# Patient Record
Sex: Female | Born: 1995 | Race: White | Hispanic: No | Marital: Single | Smoking: Never smoker
Health system: Southern US, Community
[De-identification: ages and names within clinical notes are randomized; demographics above are authoritative.]

## PROBLEM LIST (undated history)

## (undated) DIAGNOSIS — N200 Calculus of kidney: Secondary | ICD-10-CM

## (undated) DIAGNOSIS — O99013 Anemia complicating pregnancy, third trimester: Secondary | ICD-10-CM

## (undated) HISTORY — PX: OTHER SURGICAL HISTORY: SHX169

## (undated) HISTORY — PX: TYMPANOSTOMY TUBE PLACEMENT: SHX32

## (undated) HISTORY — PX: TONSILLECTOMY: SUR1361

## (undated) HISTORY — PX: KIDNEY STONE SURGERY: SHX686

---
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
## 2014-05-05 ENCOUNTER — Ambulatory Visit: Payer: Self-pay

## 2014-10-08 LAB — OB RESULTS CONSOLE GC/CHLAMYDIA
CHLAMYDIA, DNA PROBE: NEGATIVE
GC PROBE AMP, GENITAL: NEGATIVE

## 2014-10-08 LAB — OB RESULTS CONSOLE RUBELLA ANTIBODY, IGM: Rubella: NON-IMMUNE/NOT IMMUNE

## 2014-10-08 LAB — OB RESULTS CONSOLE HIV ANTIBODY (ROUTINE TESTING): HIV: NONREACTIVE

## 2014-10-08 LAB — OB RESULTS CONSOLE VARICELLA ZOSTER ANTIBODY, IGG: VARICELLA IGG: IMMUNE

## 2014-10-08 LAB — OB RESULTS CONSOLE HEPATITIS B SURFACE ANTIGEN: Hepatitis B Surface Ag: NEGATIVE

## 2014-10-08 LAB — OB RESULTS CONSOLE RPR: RPR: NONREACTIVE

## 2015-03-18 NOTE — L&D Delivery Note (Signed)
Obstetrical Delivery Note   Date of Delivery:   05/07/2015 Primary OB:   [facility] Gestational Age/EDD: [redacted]w[redacted]d (Dated by LMP) Antepartum complications: anemia  Delivered By:   Farrel Conners, CNM  Delivery Type:   spontaneous vaginal delivery  Procedure Details:   Patient with overwhelming urge to push. Anterior lip easily reduced. With good maternal effort, there was a spontaneous vaginal delivery of a viable female infant with CAN x1, reduced on perineum. Presentation was ROA with a right nuchal hand. Baby placed on mother's abdomen and after cord stopped pulsating, the cord was clamped and the FOB cut the cord. Baby was then placed skin to skin with mother. Spontaneous delivery of intact placenta and 3 vessel cord. Anesthesia:    epidural and local for repair Intrapartum complications: nuchal cord x1 GBS:    Positive-was adequately treated Laceration:    2nd degree and perineal repaired with 2-0 Vicryl and 3-0 Chromic; rectal capsule was also reinforced Episiotomy:    none Placenta:    Via active 3rd stage. To pathology: no Estimated Blood Loss:  400  Baby:    Liveborn female, Apgars 8/9, weight 7#6.9oz/ Mia    Farrel Conners, CNM

## 2015-04-17 LAB — OB RESULTS CONSOLE GC/CHLAMYDIA
CHLAMYDIA, DNA PROBE: NEGATIVE
GC PROBE AMP, GENITAL: NEGATIVE

## 2015-04-17 LAB — OB RESULTS CONSOLE GBS: STREP GROUP B AG: POSITIVE

## 2015-04-29 ENCOUNTER — Encounter: Payer: Self-pay | Admitting: *Deleted

## 2015-04-29 ENCOUNTER — Inpatient Hospital Stay
Admission: EM | Admit: 2015-04-29 | Discharge: 2015-04-29 | Disposition: A | Discharge status: Home / Self Care | Payer: Medicaid Other | Attending: Obstetrics & Gynecology | Admitting: Obstetrics & Gynecology

## 2015-04-29 DIAGNOSIS — O26893 Other specified pregnancy related conditions, third trimester: Secondary | ICD-10-CM | POA: Insufficient documentation

## 2015-04-29 DIAGNOSIS — R0981 Nasal congestion: Secondary | ICD-10-CM | POA: Diagnosis present

## 2015-04-29 DIAGNOSIS — Z3A38 38 weeks gestation of pregnancy: Secondary | ICD-10-CM | POA: Insufficient documentation

## 2015-04-29 DIAGNOSIS — J029 Acute pharyngitis, unspecified: Secondary | ICD-10-CM | POA: Diagnosis present

## 2015-04-29 DIAGNOSIS — R509 Fever, unspecified: Secondary | ICD-10-CM | POA: Insufficient documentation

## 2015-04-29 LAB — URINALYSIS COMPLETE WITH MICROSCOPIC (ARMC ONLY)
BILIRUBIN URINE: NEGATIVE
Bacteria, UA: NONE SEEN
GLUCOSE, UA: NEGATIVE mg/dL
Hgb urine dipstick: NEGATIVE
Ketones, ur: NEGATIVE mg/dL
LEUKOCYTES UA: NEGATIVE
Nitrite: NEGATIVE
Protein, ur: NEGATIVE mg/dL
SPECIFIC GRAVITY, URINE: 1.017 (ref 1.005–1.030)
pH: 7 (ref 5.0–8.0)

## 2015-04-29 LAB — INFLUENZA PANEL BY PCR (TYPE A & B)
H1N1FLUPCR: NOT DETECTED
INFLBPCR: NEGATIVE
Influenza A By PCR: NEGATIVE

## 2015-04-29 MED ORDER — ACETAMINOPHEN 325 MG PO TABS
ORAL_TABLET | ORAL | Status: AC
  Filled 2015-04-29: qty 2

## 2015-04-29 MED ORDER — ACETAMINOPHEN 500 MG PO TABS
1000.0000 mg | ORAL_TABLET | Freq: Four times a day (QID) | ORAL | Status: DC | PRN
  Administered 2015-04-29: 1000 mg via ORAL

## 2015-04-29 MED ORDER — ACETAMINOPHEN 500 MG PO TABS
ORAL_TABLET | ORAL | Status: AC
  Filled 2015-04-29: qty 2

## 2015-04-29 MED ORDER — ONDANSETRON 4 MG PO TBDP: 4.0000 mg | ORAL_TABLET | Freq: Four times a day (QID) | ORAL | Status: DC | PRN

## 2015-04-29 NOTE — Final Progress Note (Signed)
Physician Final Progress Note  Patient ID: Kristen Stevens MRN: [id number] DOB/AGE: 1995-10-01 19 y.o.  Admit date: 04/29/2015 Admitting provider: Leola Brazil, MD Discharge date: 04/29/2015   Admission Diagnoses: Flu like symptoms Pregnancy at 38.3 weeks  Discharge Diagnoses:  same  Consults: None  Significant Findings/ Diagnostic Studies: 20 year old G1 p0 with EDC=05/10/2015 by LMP who presented at 38.3 weeks with complaints of nasal congestion and sore throat since last night, followed by low grade fever to 100 today, rhinorrhea, mild sore throat, occasional cough,  and right temporal headache. Had loose stools 2-3 times/day for past few days and an episode of vomiting this Am after breakfast. She has kept down lots of water today and a little gatorade. Has not taken any medications for her symptoms. Only medication is PNV.  A friend of hers has been diagnosed with the flu. She has not had a influenza vaccine this year. She denies CP, SOB, ear ache, vaginal bleeding, leakage of water,dysuria, neck pain or rash.  Prenatal care at [facility] has been remarkable for concerns for low lying placenta (now resolved), mild anemia and GBS positive.  Medical Hx remarkable for kidney stone Exam:  Patient Vitals for the past 24 hrs:  BP Temp Temp src Pulse Resp Height Weight  04/29/15 2201 (!) 102/51 mmHg 97.9 F (36.6 C) Oral 78 - - -  04/29/15 1850 102/66 mmHg - - 91 14 - -  04/29/15 1826 107/74 mmHg - - (!) 102 - - -  04/29/15 1825 - 100.2 F (37.9 C) Oral - 16  (1.575 m) 68.04 kg (150 lb)  Head: no frontal or maxillary sinus tenderness Nose: boggy, nasla mucosa Throat: no exudates, mild inflammation Neck: no cervical lymphadenitis Lungs: CTA Heart: RRR without murmur Abdomen: soft, NT, BS active, cephalic presentation FHR: reactive with baseline 140 and accelerations to 160s, moderate variability irregular contractions, mostly irritability Results for orders placed or performed  during the hospital encounter of 04/29/15 (from the past 24 hour(s))  Influenza panel by PCR (type A & B, H1N1)     Status: None   Collection Time: 04/29/15  7:49 PM  Result Value Ref Range   Influenza A By PCR NEGATIVE NEGATIVE   Influenza B By PCR NEGATIVE NEGATIVE   H1N1 flu by pcr NOT DETECTED NOT DETECTED  Urinalysis complete, with microscopic ([facility] only)     Status: Abnormal   Collection Time: 04/29/15  7:55 PM  Result Value Ref Range   Color, Urine YELLOW (A) YELLOW   APPearance CLOUDY (A) CLEAR   Glucose, UA NEGATIVE NEGATIVE mg/dL   Bilirubin Urine NEGATIVE NEGATIVE   Ketones, ur NEGATIVE NEGATIVE mg/dL   Specific Gravity, Urine 1.017 1.005 - 1.030   Hgb urine dipstick NEGATIVE NEGATIVE   pH 7.0 5.0 - 8.0   Protein, ur NEGATIVE NEGATIVE mg/dL   Nitrite NEGATIVE NEGATIVE   Leukocytes, UA NEGATIVE NEGATIVE   RBC / HPF 0-5 0 - 5 RBC/hpf   WBC, UA 0-5 0 - 5 WBC/hpf   Bacteria, UA NONE SEEN NONE SEEN   Squamous Epithelial / LPF 0-5 (A) NONE SEEN   Mucous PRESENT    Amorphous Crystal PRESENT     While awaiting lab results, patient was given tylenol m which resolved her headache and fever. She ate  And drank without nausea and vomiting. No evidence of UTI and influenza test was negative A: Flu like symptoms/ viral syndrome IUP at 38.3 weeks Reactive non stress test P: Dc home  with instructions to take Tylenol for fever and body aches, antihistimines to decrease nasal secretions, saline nasal spray for nasal congestion, Mucinex if needed for cough.   Discharge Condition: stable  Disposition: 01-Home or Self Care  Diet: as tolerated  Discharge Activity: as tolerated     Medication List    ASK your doctor about these medications        multivitamin-prenatal 27-0.8 MG Tabs tablet  Take 1 tablet by mouth daily at 12 noon.       Follow up at [facility] scheduled for 02/27/2016.  Total time spent taking care of this patient: 20 minutes  Signed: Farrel Conners 04/29/2015, 10:33 PM

## 2015-05-01 LAB — CULTURE, GROUP A STREP (THRC)

## 2015-05-01 LAB — URINE CULTURE

## 2015-05-05 ENCOUNTER — Observation Stay
Admission: AD | Admit: 2015-05-05 | Discharge: 2015-05-05 | Disposition: A | Discharge status: Home / Self Care | Payer: Medicaid Other | Source: Home / Self Care | Attending: Obstetrics & Gynecology | Admitting: Obstetrics & Gynecology

## 2015-05-05 DIAGNOSIS — R109 Unspecified abdominal pain: Secondary | ICD-10-CM

## 2015-05-05 DIAGNOSIS — Z3A39 39 weeks gestation of pregnancy: Secondary | ICD-10-CM

## 2015-05-05 DIAGNOSIS — O26899 Other specified pregnancy related conditions, unspecified trimester: Secondary | ICD-10-CM

## 2015-05-05 MED ORDER — ONDANSETRON HCL 4 MG/2ML IJ SOLN: 4.0000 mg | Freq: Four times a day (QID) | INTRAMUSCULAR | Status: DC | PRN

## 2015-05-05 MED ORDER — ACETAMINOPHEN 325 MG PO TABS: 650.0000 mg | ORAL_TABLET | ORAL | Status: DC | PRN

## 2015-05-05 NOTE — Progress Notes (Signed)
Dr Tiburcio Pea called, informed of current events. Informed of UC pattern, FHR tracing, VS, SVE, pt was scheduled for induction tonight and was moved to tomorrow. Order received to continue to monitor until 9pm, if FHR remains reassuring and no cervical change may d/c home to return tomorrow for scheduled induction.

## 2015-05-05 NOTE — OB Triage Note (Addendum)
Pt presents to L&D very teary with her mother, who was very concerned of recent events. Pt and the FOB got into a heated argument. Pt's mother, was there and states the patient got so upset over the argument and tripped over her feet and fell. Pt states she has been having contractions all day. Pt was also posted for elective induction tonight but was bumped. I asked if that precipitated the fight and she denied it. Pts mother pulled me aside and said she had never seen her daughter that upset and her daughter was saying things like she wanted to give the baby up for adoption and the patient's mother said she was honestly afraid she would hurt herself or the baby. I went in to assess the pt alone, and talked with her about the situation. I asked her if she wanted to hurt herself or the baby or had any questions about adoptions. That we were here to help her if she needed help and could get her in touch with resources. Pt denied all, and said we just had a really bad fight and I was saying things I didn't mean. Pt denies LOF and says she feels contractions but they aren't that painful. EFM applied and explained, plan to monitor fetal and maternal well being and assess for labor and s/p fall.

## 2015-05-05 NOTE — Progress Notes (Signed)
Pt in much better emotional state as well as pt's mother. Reports feeling contractions but reports they aren't very painful. Cervix rechecked because of contraction pattern, no cervical change, reviewed term d/c instructions and reviewed induction instructions for tomorrow night. Pt verbalizes undsertanding.

## 2015-05-05 NOTE — Discharge Instructions (Signed)
Call provider or return to birthplace with:  1. Strong regular contractions every 5 minutes. 2. Leaking of fluid from your vagina 3. Vaginal bleeding: Bright red or heavy like a period 4. Decreased Fetal movement  Return to [facility] 05/06/15 at 7:30 pm for induction

## 2015-05-06 ENCOUNTER — Inpatient Hospital Stay
Admission: RE | Admit: 2015-05-06 | Discharge: 2015-05-09 | DRG: 775 | Disposition: A | Discharge status: Home / Self Care | Payer: Medicaid Other | Attending: Obstetrics and Gynecology | Admitting: Obstetrics and Gynecology

## 2015-05-06 DIAGNOSIS — O480 Post-term pregnancy: Secondary | ICD-10-CM | POA: Diagnosis present

## 2015-05-06 DIAGNOSIS — Z3A39 39 weeks gestation of pregnancy: Secondary | ICD-10-CM

## 2015-05-06 DIAGNOSIS — O26899 Other specified pregnancy related conditions, unspecified trimester: Secondary | ICD-10-CM

## 2015-05-06 DIAGNOSIS — R109 Unspecified abdominal pain: Secondary | ICD-10-CM

## 2015-05-06 LAB — CBC
HEMATOCRIT: 30.6 % — AB (ref 35.0–47.0)
Hemoglobin: 10 g/dL — ABNORMAL LOW (ref 12.0–16.0)
MCH: 24.8 pg — AB (ref 26.0–34.0)
MCHC: 32.6 g/dL (ref 32.0–36.0)
MCV: 76.1 fL — AB (ref 80.0–100.0)
Platelets: 252 10*3/uL (ref 150–440)
RBC: 4.02 MIL/uL (ref 3.80–5.20)
RDW: 14.8 % — AB (ref 11.5–14.5)
WBC: 15.7 10*3/uL — ABNORMAL HIGH (ref 3.6–11.0)

## 2015-05-06 LAB — TYPE AND SCREEN
ABO/RH(D): O POS
Antibody Screen: NEGATIVE

## 2015-05-06 LAB — ABO/RH: ABO/RH(D): O POS

## 2015-05-06 MED ORDER — SODIUM CHLORIDE 0.9 % IV SOLN
1.0000 g | INTRAVENOUS | Status: DC
  Administered 2015-05-07 (×4): 1 g via INTRAVENOUS
  Filled 2015-05-06 (×9): qty 1000

## 2015-05-06 MED ORDER — LACTATED RINGERS IV SOLN
INTRAVENOUS | Status: DC
  Administered 2015-05-06 – 2015-05-07 (×3): via INTRAVENOUS
  Administered 2015-05-07: 125 mL/h via INTRAVENOUS

## 2015-05-06 MED ORDER — OXYTOCIN 10 UNIT/ML IJ SOLN
INTRAMUSCULAR | Status: AC
  Filled 2015-05-06: qty 2

## 2015-05-06 MED ORDER — LIDOCAINE HCL (PF) 1 % IJ SOLN
INTRAMUSCULAR | Status: AC
  Filled 2015-05-06: qty 30

## 2015-05-06 MED ORDER — OXYTOCIN BOLUS FROM INFUSION: 500.0000 mL | INTRAVENOUS | Status: DC

## 2015-05-06 MED ORDER — TERBUTALINE SULFATE 1 MG/ML IJ SOLN: 0.2500 mg | Freq: Once | INTRAMUSCULAR | Status: DC | PRN

## 2015-05-06 MED ORDER — DINOPROSTONE 10 MG VA INST
10.0000 mg | VAGINAL_INSERT | Freq: Once | VAGINAL | Status: DC
  Filled 2015-05-06 (×2): qty 1

## 2015-05-06 MED ORDER — LACTATED RINGERS IV SOLN: 500.0000 mL | INTRAVENOUS | Status: DC | PRN

## 2015-05-06 MED ORDER — OXYTOCIN 40 UNITS IN LACTATED RINGERS INFUSION - SIMPLE MED
2.5000 [IU]/h | INTRAVENOUS | Status: DC
  Administered 2015-05-07: 39.96 [IU]/h via INTRAVENOUS
  Filled 2015-05-06: qty 1000

## 2015-05-06 MED ORDER — MISOPROSTOL 25 MCG QUARTER TABLET
25.0000 ug | ORAL_TABLET | ORAL | Status: DC
  Administered 2015-05-06: 25 ug via VAGINAL
  Filled 2015-05-06 (×2): qty 1

## 2015-05-06 MED ORDER — ONDANSETRON HCL 4 MG/2ML IJ SOLN: 4.0000 mg | Freq: Four times a day (QID) | INTRAMUSCULAR | Status: DC | PRN

## 2015-05-06 MED ORDER — ACETAMINOPHEN 325 MG PO TABS
650.0000 mg | ORAL_TABLET | ORAL | Status: DC | PRN
  Administered 2015-05-06: 650 mg via ORAL
  Filled 2015-05-06: qty 2

## 2015-05-06 MED ORDER — ZOLPIDEM TARTRATE 5 MG PO TABS
5.0000 mg | ORAL_TABLET | Freq: Every evening | ORAL | Status: DC | PRN
  Administered 2015-05-06: 5 mg via ORAL
  Filled 2015-05-06: qty 1

## 2015-05-06 MED ORDER — SODIUM CHLORIDE 0.9 % IV SOLN
2.0000 g | Freq: Once | INTRAVENOUS | Status: AC
  Administered 2015-05-06: 2 g via INTRAVENOUS
  Filled 2015-05-06: qty 2000

## 2015-05-06 MED ORDER — BUTORPHANOL TARTRATE 1 MG/ML IJ SOLN
1.0000 mg | INTRAMUSCULAR | Status: DC | PRN
  Administered 2015-05-07 (×4): 1 mg via INTRAVENOUS
  Filled 2015-05-06 (×5): qty 1

## 2015-05-06 MED ORDER — AMMONIA AROMATIC IN INHA
RESPIRATORY_TRACT | Status: AC
  Filled 2015-05-06: qty 10

## 2015-05-06 MED ORDER — MISOPROSTOL 200 MCG PO TABS
ORAL_TABLET | ORAL | Status: AC
  Filled 2015-05-06: qty 4

## 2015-05-06 NOTE — Anesthesia Preprocedure Evaluation (Addendum)
Anesthesia Evaluation  Patient identified by MRN, date of birth, ID band Patient awake    Reviewed: Allergy & Precautions, H&P , NPO status , Patient's Chart, lab work & pertinent test results, reviewed documented beta blocker date and time   Airway Mallampati: III  TM Distance: >3 FB Neck ROM: full    Dental no notable dental hx. (+) Teeth Intact   Pulmonary neg pulmonary ROS,    Pulmonary exam normal breath sounds clear to auscultation       Cardiovascular Exercise Tolerance: Good negative cardio ROS Normal cardiovascular exam Rhythm:regular Rate:Normal     Neuro/Psych negative neurological ROS  negative psych ROS   GI/Hepatic negative GI ROS, Neg liver ROS,   Endo/Other  negative endocrine ROS  Renal/GU negative Renal ROS  negative genitourinary   Musculoskeletal   Abdominal   Peds  Hematology negative hematology ROS (+)   Anesthesia Other Findings   Reproductive/Obstetrics (+) Pregnancy                             Anesthesia Physical Anesthesia Plan  ASA: II  Anesthesia Plan: Regional and Epidural   Post-op Pain Management:    Induction:   Airway Management Planned:   Additional Equipment:   Intra-op Plan:   Post-operative Plan:   Informed Consent: I have reviewed the patients History and Physical, chart, labs and discussed the procedure including the risks, benefits and alternatives for the proposed anesthesia with the patient or authorized representative who has indicated his/her understanding and acceptance.     Plan Discussed with: CRNA  Anesthesia Plan Comments:         Anesthesia Quick Evaluation  

## 2015-05-06 NOTE — H&P (Signed)
History and Physical Interval Note:  05/06/2015 9:04 PM  Kristen Stevens  has presented today for induction of labor with the diagnosis of post term pregnancy.   The various methods of treatment have been discussed with the patient and family. After consideration of risks, benefits and other options for treatment, the patient agrees to cervadil inductions .  The patient's history has been reviewed, patient examined, no change in status, stable.   I have reviewed the patient's chart and labs.  Questions were answered to the patient's satisfaction.    [name]

## 2015-05-07 ENCOUNTER — Encounter: Payer: Self-pay | Admitting: *Deleted

## 2015-05-07 ENCOUNTER — Encounter: Payer: Self-pay | Admitting: Anesthesiology

## 2015-05-07 ENCOUNTER — Inpatient Hospital Stay: Payer: Medicaid Other | Admitting: Anesthesiology

## 2015-05-07 MED ORDER — PHENYLEPHRINE 40 MCG/ML (10ML) SYRINGE FOR IV PUSH (FOR BLOOD PRESSURE SUPPORT)
80.0000 ug | PREFILLED_SYRINGE | INTRAVENOUS | Status: DC | PRN
  Filled 2015-05-07: qty 2

## 2015-05-07 MED ORDER — ONDANSETRON HCL 4 MG PO TABS: 4.0000 mg | ORAL_TABLET | ORAL | Status: DC | PRN

## 2015-05-07 MED ORDER — EPHEDRINE 5 MG/ML INJ
10.0000 mg | INTRAVENOUS | Status: DC | PRN
  Filled 2015-05-07: qty 2

## 2015-05-07 MED ORDER — HYDROCODONE-ACETAMINOPHEN 5-325 MG PO TABS: 1.0000 | ORAL_TABLET | ORAL | Status: DC | PRN

## 2015-05-07 MED ORDER — LANOLIN HYDROUS EX OINT: TOPICAL_OINTMENT | CUTANEOUS | Status: DC | PRN

## 2015-05-07 MED ORDER — FENTANYL 2.5 MCG/ML W/ROPIVACAINE 0.2% IN NS 100 ML EPIDURAL INFUSION (ARMC-ANES)
9.0000 mL/h | EPIDURAL | Status: DC
  Filled 2015-05-07: qty 100

## 2015-05-07 MED ORDER — FERROUS SULFATE 325 (65 FE) MG PO TABS
325.0000 mg | ORAL_TABLET | Freq: Every day | ORAL | Status: DC
  Administered 2015-05-08: 325 mg via ORAL
  Filled 2015-05-07: qty 1

## 2015-05-07 MED ORDER — BUPIVACAINE HCL (PF) 0.25 % IJ SOLN
INTRAMUSCULAR | Status: DC | PRN
  Administered 2015-05-07 (×2): 4 mL via EPIDURAL

## 2015-05-07 MED ORDER — IBUPROFEN 600 MG PO TABS
ORAL_TABLET | ORAL | Status: AC
  Administered 2015-05-07: 600 mg via ORAL
  Filled 2015-05-07: qty 1

## 2015-05-07 MED ORDER — TERBUTALINE SULFATE 1 MG/ML IJ SOLN: 0.2500 mg | Freq: Once | INTRAMUSCULAR | Status: DC | PRN

## 2015-05-07 MED ORDER — DIPHENHYDRAMINE HCL 50 MG/ML IJ SOLN: 12.5000 mg | INTRAMUSCULAR | Status: DC | PRN

## 2015-05-07 MED ORDER — FENTANYL 2.5 MCG/ML W/ROPIVACAINE 0.2% IN NS 100 ML EPIDURAL INFUSION (ARMC-ANES): 10.0000 mL/h | EPIDURAL | Status: DC

## 2015-05-07 MED ORDER — ONDANSETRON HCL 4 MG/2ML IJ SOLN: 4.0000 mg | INTRAMUSCULAR | Status: DC | PRN

## 2015-05-07 MED ORDER — DOCUSATE SODIUM 100 MG PO CAPS
100.0000 mg | ORAL_CAPSULE | Freq: Every day | ORAL | Status: DC
  Administered 2015-05-08 – 2015-05-09 (×2): 100 mg via ORAL
  Filled 2015-05-07 (×3): qty 1

## 2015-05-07 MED ORDER — IBUPROFEN 600 MG PO TABS
600.0000 mg | ORAL_TABLET | Freq: Four times a day (QID) | ORAL | Status: DC | PRN
  Administered 2015-05-07 – 2015-05-08 (×4): 600 mg via ORAL
  Filled 2015-05-07 (×4): qty 1

## 2015-05-07 MED ORDER — LIDOCAINE HCL (PF) 1 % IJ SOLN
INTRAMUSCULAR | Status: DC | PRN
  Administered 2015-05-07: 3 mL via SUBCUTANEOUS
  Administered 2015-05-07: 30 mL

## 2015-05-07 MED ORDER — LACTATED RINGERS IV SOLN
500.0000 mL | Freq: Once | INTRAVENOUS | Status: AC
  Administered 2015-05-07: 500 mL via INTRAVENOUS

## 2015-05-07 MED ORDER — OXYTOCIN 40 UNITS IN LACTATED RINGERS INFUSION - SIMPLE MED
1.0000 m[IU]/min | INTRAVENOUS | Status: DC
  Administered 2015-05-07: 1 m[IU]/min via INTRAVENOUS
  Administered 2015-05-07: 5 m[IU]/min via INTRAVENOUS
  Administered 2015-05-07: 6 m[IU]/min via INTRAVENOUS
  Administered 2015-05-07: 7 m[IU]/min via INTRAVENOUS
  Administered 2015-05-07: 3 m[IU]/min via INTRAVENOUS

## 2015-05-07 MED ORDER — LIDOCAINE-EPINEPHRINE (PF) 1.5 %-1:200000 IJ SOLN
INTRAMUSCULAR | Status: DC | PRN
  Administered 2015-05-07: 3 mL via PERINEURAL

## 2015-05-07 MED ORDER — BENZOCAINE-MENTHOL 20-0.5 % EX AERO
INHALATION_SPRAY | CUTANEOUS | Status: AC
  Filled 2015-05-07: qty 56

## 2015-05-07 MED ORDER — FENTANYL 2.5 MCG/ML W/ROPIVACAINE 0.2% IN NS 100 ML EPIDURAL INFUSION (ARMC-ANES)
EPIDURAL | Status: DC | PRN
  Administered 2015-05-07: 10 mL/h via EPIDURAL

## 2015-05-07 MED ORDER — BENZOCAINE-MENTHOL 20-0.5 % EX AERO
1.0000 | INHALATION_SPRAY | CUTANEOUS | Status: DC | PRN
Start: 2015-05-07 — End: 2015-05-09

## 2015-05-07 MED ORDER — SIMETHICONE 80 MG PO CHEW
80.0000 mg | CHEWABLE_TABLET | ORAL | Status: DC | PRN
Start: 2015-05-07 — End: 2015-05-09

## 2015-05-07 MED ORDER — LACTATED RINGERS IV SOLN: 500.0000 mL | Freq: Once | INTRAVENOUS | Status: DC

## 2015-05-07 MED ORDER — PRENATAL MULTIVITAMIN CH
1.0000 | ORAL_TABLET | Freq: Every day | ORAL | Status: DC
  Administered 2015-05-08 – 2015-05-09 (×2): 1 via ORAL
  Filled 2015-05-07 (×2): qty 1

## 2015-05-07 NOTE — Discharge Summary (Signed)
Physician Obstetric Discharge Summary  Patient ID: Kristen Stevens MRN: [id number] DOB/AGE: 1995/08/04 19 y.o.   Date of Admission: 05/06/2015  Date of Discharge:   Admitting Diagnosis: Induction of labor at [redacted]w[redacted]d  Secondary Diagnosis: Patient Active Problem List   Diagnosis Date Noted  . Postpartum care following vaginal delivery 05/09/2015  . Post-dates pregnancy 05/06/2015  . Labor and delivery, indication for care 05/05/2015  . Abdominal pain affecting pregnancy 05/05/2015     Mode of Delivery: normal spontaneous vaginal delivery on 05/07/2015       Discharge Diagnosis: Term intrauterine pregnancy delivered,    Intrapartum Procedures: epidural, GBS prophylaxis, pitocin augmentation, placement of intrauterine catheter and cytotec induction, repair of second degree perineal laceration   Post partum procedures: none  Complications:none apparent  Brief Hospital Course  Kristen Stevens is a G1P1001 who had a SVD on 05/07/2015 after an elective induction;  for further details of this delivery, please refer to the delivery note.  Patient had an uncomplicated postpartum course.  By time of discharge on PPD#2, her pain was controlled on oral pain medications; she had appropriate lochia and was ambulating, voiding without difficulty and tolerating regular diet.  She was deemed stable for discharge to home.   Labs: CBC Latest Ref Rng 05/09/2015 05/08/2015 05/06/2015  WBC 3.6 - 11.0 K/uL - 17.7(H) 15.7(H)  Hemoglobin 12.0 - 16.0 g/dL - 7.4(L) 10.0(L)  Hematocrit 35.0 - 47.0 % 22.0(L) 22.7(L) 30.6(L)  Platelets 150 - 440 K/uL - 182 252   O POS/ RNI/ VI/ GBS positive  Physical exam:  Blood pressure 102/69, pulse 69, temperature 98.5 F (36.9 C), temperature source Oral, resp. rate 20, SpO2 100 %,  General: alert and no distress Lochia: appropriate Abdomen: soft, NT Uterine Fundus: firm Extremities: No evidence of DVT seen on physical exam. No lower extremity edema.  Discharge  Instructions: Per After Visit Summary. Activity: Advance as tolerated. Pelvic rest for 6 weeks.  Also refer to Discharge Instructions Diet: Regular Medications:   Medication List    TAKE these medications        benzocaine-Menthol 20-0.5 % Aero  Commonly known as:  DERMOPLAST  Apply 1 application topically as needed for irritation (perineal discomfort).     docusate sodium 100 MG capsule  Commonly known as:  COLACE  Take 1 capsule (100 mg total) by mouth daily.     ferrous sulfate 325 (65 FE) MG tablet  Take 1 tablet (325 mg total) by mouth 3 (three) times daily with meals.     ibuprofen 600 MG tablet  Commonly known as:  ADVIL,MOTRIN  Take 1 tablet (600 mg total) by mouth every 6 (six) hours as needed for mild pain, moderate pain or cramping.     multivitamin-prenatal 27-0.8 MG Tabs tablet  Take 1 tablet by mouth daily at 12 noon.       Outpatient follow up:  Follow-up Information    Follow up with GUTIERREZ, COLLEEN, CNM In 6 weeks.   Specialty:  Certified Nurse Midwife   Why:  for routine post partum visit   Contact information:   [address] [place] [address] [phone]      Postpartum contraception: IUD  Discharged Condition: good  Discharged to: home   Newborn Data: Disposition:home with mother  Apgars: APGAR (1 MIN): 8   APGAR (5 MINS): 9   APGAR (10 MINS):    Baby Feeding: Breast (pumping) and formula  Ward, Elenora Fender, MD 05/09/2015 12:04 PM

## 2015-05-07 NOTE — Anesthesia Preprocedure Evaluation (Signed)
Anesthesia Evaluation    Airway        Dental   Pulmonary           Cardiovascular      Neuro/Psych    GI/Hepatic   Endo/Other    Renal/GU      Musculoskeletal   Abdominal   Peds  Hematology   Anesthesia Other Findings Mom says that pt not allergic to lidocaine. Dentist injected with novocaine  And that side of her face swell. No other problems, such as, hypotension fainting etc. OK to use lidocaine for numbing the skin.  Reproductive/Obstetrics                             Anesthesia Physical Anesthesia Plan Anesthesia Quick Evaluation

## 2015-05-07 NOTE — Anesthesia Procedure Notes (Signed)
Epidural Patient location during procedure: OB Start time: 05/07/2015 1:06 PM End time: 05/07/2015 1:16 PM  Staffing Anesthesiologist: Berdine Addison Resident/CRNA: Malva Cogan Performed by: resident/CRNA   Preanesthetic Checklist Completed: patient identified, site marked, surgical consent, pre-op evaluation, timeout performed, IV checked, risks and benefits discussed and monitors and equipment checked  Epidural Patient position: sitting Prep: Betadine Patient monitoring: heart rate, continuous pulse ox and blood pressure Approach: midline Location: L3-L4 Injection technique: LOR saline  Needle:  Needle type: Tuohy  Needle gauge: 18 G Needle length: 9 cm and 9 Needle insertion depth: 6 cm Catheter type: closed end flexible Catheter size: 20 Guage Catheter at skin depth: 11 cm Test dose: negative and 1.5% lidocaine with Epi 1:200 K  Assessment Sensory level: T10 Events: blood not aspirated, injection not painful, no injection resistance, negative IV test and no paresthesia  Additional Notes   Patient tolerated the insertion well without complications.Reason for block:procedure for pain

## 2015-05-07 NOTE — Progress Notes (Signed)
L&D Progress Notes  S: Much more comfortable after epidural, still feeling pressure in pelvis  O: vss General: trying to sleep, but complains of above FHR 120s with accels to 140s, moderate variability Toco: difficulty picking up contractions with external toco SROM for clear fluid at 1148 Cervix: 3-3.5/80%/-1 IUPC inserted and contractions are q2-3 min apart, but not adequate mvus Pitocin was stopped until had better pain control with epidural  A: Some progress, now with better pain control P: Restart Pitocin and titrate to 200-250 mvus Consult anesthesia to see if can provide denser block  Naysa Puskas, CNM

## 2015-05-07 NOTE — Progress Notes (Signed)
L&D Progress Note  20 year old G1 P0 with EDC=05/10/2015 presented last night for a elective induction with cervix 1/80%. She received one dose of Cytotec and has been contracting during the night. Received 3 doses of Stadol, last one about 30 min ago.  S: Sleepy from Stadol. Frustrated with lack of progress. C: BP 107/62 mmHg  Pulse 74  Temp(Src) 98.8 F (37.1 C) (Oral)  Resp 18  Ht  (1.575 m)  Wt 70.761 kg (156 lb)  BMI 28.53 kg/m2  General: sleepy in no acute distress FHR: 130 with accelerations and moderate variability Toco: q1-5 min apart with coupling, slept thru some of these contractions Cervix: 1+/80%/-1 to -2  A: IUP at 39.4 weeks admitted for elective IOL-little progress so far GBS positive-PPX already started  P: Plan to let patient eat a little something and shower then start Pitocin Reassured patient that it often takes time to get a primip in labor, and it is not uncommon for early labor to take time.  Kristen Stevens, CNM  Kristen Stevens, CNM

## 2015-05-08 LAB — CBC
HCT: 22.7 % — ABNORMAL LOW (ref 35.0–47.0)
Hemoglobin: 7.4 g/dL — ABNORMAL LOW (ref 12.0–16.0)
MCH: 24.4 pg — AB (ref 26.0–34.0)
MCHC: 32.6 g/dL (ref 32.0–36.0)
MCV: 74.9 fL — ABNORMAL LOW (ref 80.0–100.0)
PLATELETS: 182 10*3/uL (ref 150–440)
RBC: 3.03 MIL/uL — AB (ref 3.80–5.20)
RDW: 14.9 % — ABNORMAL HIGH (ref 11.5–14.5)
WBC: 17.7 10*3/uL — ABNORMAL HIGH (ref 3.6–11.0)

## 2015-05-08 LAB — RPR: RPR Ser Ql: NONREACTIVE

## 2015-05-08 MED ORDER — FERROUS SULFATE 325 (65 FE) MG PO TABS
325.0000 mg | ORAL_TABLET | Freq: Two times a day (BID) | ORAL | Status: DC
  Administered 2015-05-08: 325 mg via ORAL
  Filled 2015-05-08: qty 1

## 2015-05-08 MED ORDER — INFLUENZA VAC SPLIT QUAD 0.5 ML IM SUSY: 0.5000 mL | PREFILLED_SYRINGE | INTRAMUSCULAR | Status: DC

## 2015-05-08 NOTE — Progress Notes (Signed)
Post Partum Day 1 Subjective: Doing well, no complaints.  Tolerating regular diet, pain with PO meds, voiding and ambulating without difficulty.  No CP SOB F/C N/V or leg pain No HA change of vision, RUQ/epigastric pain  Objective: BP 102/55 mmHg  Pulse 61  Temp(Src) 97.9 F (36.6 C) (Oral)  Resp 18  Ht  (1.575 m)  Wt 70.761 kg (156 lb)  BMI 28.53 kg/m2  SpO2 99%  LMP 08/03/2014  Breastfeeding  Physical Exam:  General: NAD CV: RRR Pulm: nl effort, CTABL Lochia: moderate Uterine Fundus: fundus firm and below umbilicus DVT Evaluation: no cords, ttp LEs    Recent Labs  05/06/15 2104 05/08/15 0448  HGB 10.0* 7.4*  HCT 30.6* 22.7*  WBC 15.7* 17.7*  PLT 252 182    Assessment/Plan: 19 y.o. G1P1001 postpartum day # 1  1. Postpartum care vaginal delivery 2. Discharge to home with baby tomorrow 3. Follow up care with Consuella Lose    ----- Tresea Mall, CNM   This patient and plan were discussed with Dr Vergie Living 05/08/2015     Changed to bid iron and will get am Hct on 2/22 to ensure stability. No current s/s of anemia and hopefully no need for transfusion since young and healthy  Kristen Copa MD [facility]  Pager: [phone]

## 2015-05-08 NOTE — Anesthesia Postprocedure Evaluation (Signed)
Anesthesia Post Note  Patient: Kristen Stevens  Procedure(s) Performed: * No procedures listed *  Patient location during evaluation: Mother Baby Anesthesia Type: Epidural Level of consciousness: awake and alert Pain management: pain level controlled Vital Signs Assessment: post-procedure vital signs reviewed and stable Respiratory status: spontaneous breathing Cardiovascular status: blood pressure returned to baseline Postop Assessment: no headache and no backache Anesthetic complications: no    Last Vitals:  Filed Vitals:   05/07/15 2312 05/08/15 0418  BP: 116/69 104/58  Pulse: 98 66  Temp: 36.9 C 36.6 C  Resp: 18 18    Last Pain:  Filed Vitals:   05/08/15 0418  PainSc: 0-No pain                 Mathews Argyle P

## 2015-05-09 LAB — HEMATOCRIT: HCT: 22 % — ABNORMAL LOW (ref 35.0–47.0)

## 2015-05-09 MED ORDER — IBUPROFEN 600 MG PO TABS
600.0000 mg | ORAL_TABLET | Freq: Four times a day (QID) | ORAL | Status: DC | PRN
Start: 2015-05-09 — End: 2016-12-25

## 2015-05-09 MED ORDER — DOCUSATE SODIUM 100 MG PO CAPS: 100.0000 mg | ORAL_CAPSULE | Freq: Every day | ORAL | Status: DC

## 2015-05-09 MED ORDER — BENZOCAINE-MENTHOL 20-0.5 % EX AERO: 1.0000 "application " | INHALATION_SPRAY | CUTANEOUS | Status: DC | PRN

## 2015-05-09 MED ORDER — FERROUS SULFATE 325 (65 FE) MG PO TABS: 325.0000 mg | ORAL_TABLET | Freq: Three times a day (TID) | ORAL | Status: DC

## 2015-05-09 MED ORDER — FERROUS SULFATE 325 (65 FE) MG PO TABS
325.0000 mg | ORAL_TABLET | Freq: Three times a day (TID) | ORAL | Status: DC
  Administered 2015-05-09: 325 mg via ORAL
  Filled 2015-05-09: qty 1

## 2015-05-09 MED ORDER — MEASLES, MUMPS & RUBELLA VAC ~~LOC~~ INJ
0.5000 mL | INJECTION | Freq: Once | SUBCUTANEOUS | Status: AC
  Administered 2015-05-09: 0.5 mL via SUBCUTANEOUS
  Filled 2015-05-09: qty 0.5

## 2015-05-09 NOTE — Progress Notes (Signed)
Discharge instructions reviewed with patient for herself and newborn.  Pt verb u/o

## 2015-05-09 NOTE — Progress Notes (Signed)
Discharged to home to car with newborn and auxillary

## 2015-05-09 NOTE — Final Progress Note (Signed)
Physician Final Progress Note  Patient ID: Kristen Stevens MRN: [id number] DOB/AGE: [birth year] 19 y.o.  Admit date: 05/05/2015 Admitting provider: Nadara Mustard, MD Discharge date: 05/09/2015   Admission Diagnoses: Patient presented for evaluation of labor.  Patient had cervical exam by RN and this was reported to me. I reviewed her vital signs and fetal tracing, both of which were reassuring.  Patient was discharge as she was not laboring.  Discharge Diagnoses:  Active Problems:   Labor and delivery, indication for care   Abdominal pain affecting pregnancy  Procedures: A NST procedure was performed with FHR monitoring and a normal baseline established, appropriate time of 20-40 minutes of evaluation, and accels >2 seen w 15x15 characteristics.  Results show a REACTIVE NST.   Discharge Condition: good  Disposition: 01-Home or Self Care  Diet: Regular diet  Discharge Activity: Activity as tolerated     Medication List    ASK your doctor about these medications        multivitamin-prenatal 27-0.8 MG Tabs tablet  Take 1 tablet by mouth daily at 12 noon.           Follow-up Information    Follow up with [facility] On 05/06/2015.   Why:  For induction at 7:30pm   Contact information:   [address] [id number] [place] [address] [phone]      Signed: [name] 05/09/2015, 12:32 PM

## 2015-05-09 NOTE — Discharge Instructions (Signed)

## 2016-04-02 ENCOUNTER — Emergency Department
Admission: EM | Admit: 2016-04-02 | Discharge: 2016-04-02 | Disposition: A | Discharge status: Home / Self Care | Payer: Medicaid Other | Attending: Student in an Organized Health Care Education/Training Program | Admitting: Student in an Organized Health Care Education/Training Program

## 2016-04-02 ENCOUNTER — Encounter: Payer: Self-pay | Admitting: Emergency Medicine

## 2016-04-02 DIAGNOSIS — Z791 Long term (current) use of non-steroidal anti-inflammatories (NSAID): Secondary | ICD-10-CM | POA: Insufficient documentation

## 2016-04-02 DIAGNOSIS — N764 Abscess of vulva: Secondary | ICD-10-CM | POA: Insufficient documentation

## 2016-04-02 DIAGNOSIS — Z79899 Other long term (current) drug therapy: Secondary | ICD-10-CM | POA: Insufficient documentation

## 2016-04-02 HISTORY — DX: Calculus of kidney: N20.0

## 2016-04-02 MED ORDER — SULFAMETHOXAZOLE-TRIMETHOPRIM 800-160 MG PO TABS
1.0000 | ORAL_TABLET | Freq: Two times a day (BID) | ORAL | Status: AC
  Administered 2016-04-02 (×2): 1 via ORAL
  Filled 2016-04-02 (×2): qty 1

## 2016-04-02 MED ORDER — KETOROLAC TROMETHAMINE 30 MG/ML IJ SOLN
30.0000 mg | Freq: Once | INTRAMUSCULAR | Status: AC
  Administered 2016-04-02: 30 mg via INTRAMUSCULAR
  Filled 2016-04-02: qty 1

## 2016-04-02 MED ORDER — NAPROXEN 500 MG PO TABS: 500.0000 mg | ORAL_TABLET | Freq: Two times a day (BID) | ORAL | 0 refills | Status: DC

## 2016-04-02 MED ORDER — CEFTRIAXONE SODIUM 1 G IJ SOLR
500.0000 mg | Freq: Once | INTRAMUSCULAR | Status: AC
  Administered 2016-04-02: 500 mg via INTRAMUSCULAR
  Filled 2016-04-02: qty 10

## 2016-04-02 MED ORDER — SULFAMETHOXAZOLE-TRIMETHOPRIM 800-160 MG PO TABS: 1.0000 | ORAL_TABLET | Freq: Two times a day (BID) | ORAL | 0 refills | Status: DC

## 2016-04-02 NOTE — ED Provider Notes (Signed)
[facility] Emergency Department Provider Note  ____________________________________________  Time seen: Approximately 3:18 PM  I have reviewed the triage vital signs and the nursing notes.   HISTORY  Chief Complaint Abscess    HPI Kristen Stevens is a 21 y.o. female , NAD, presents to the emergency department with several day history of skin sore to the left labia. Patient states the store started as a small bump. She attempted to squeeze the area and notes that she had increase of swelling and drainage today. He believes that the skin sore began due to rubbing of her undergarments and pants in the area. Has not had any fevers, chills or body aches. Denies abdominal pain, nausea, vomiting. No changes in urinary habits. Denies pelvic pain or vaginal discharge.   Past Medical History:  Diagnosis Date  . Kidney stones     Patient Active Problem List   Diagnosis Date Noted  . Postpartum care following vaginal delivery 05/09/2015  . Post-dates pregnancy 05/06/2015  . Labor and delivery, indication for care 05/05/2015  . Abdominal pain affecting pregnancy 05/05/2015    Past Surgical History:  Procedure Laterality Date  . KIDNEY STONE SURGERY    . TONSILLECTOMY    . Tubes in ears      Prior to Admission medications   Medication Sig Start Date End Date Taking? Authorizing Provider  benzocaine-Menthol (DERMOPLAST) 20-0.5 % AERO Apply 1 application topically as needed for irritation (perineal discomfort). 05/09/15   Chelsea C Ward, MD  docusate sodium (COLACE) 100 MG capsule Take 1 capsule (100 mg total) by mouth daily. 05/09/15   Chelsea C Ward, MD  ferrous sulfate 325 (65 FE) MG tablet Take 1 tablet (325 mg total) by mouth 3 (three) times daily with meals. 05/09/15   Elenora Fenderhelsea C Ward, MD  ibuprofen (ADVIL,MOTRIN) 600 MG tablet Take 1 tablet (600 mg total) by mouth every 6 (six) hours as needed for mild pain, moderate pain or cramping. 05/09/15   Elenora Fenderhelsea C  Ward, MD  naproxen (NAPROSYN) 500 MG tablet Take 1 tablet (500 mg total) by mouth 2 (two) times daily with a meal. 04/02/16   Lonisha Bobby L Brunilda Eble, PA-C  Prenatal Vit-Fe Fumarate-FA (MULTIVITAMIN-PRENATAL) 27-0.8 MG TABS tablet Take 1 tablet by mouth daily at 12 noon.    Historical Provider, MD  sulfamethoxazole-trimethoprim (BACTRIM DS,SEPTRA DS) 800-160 MG tablet Take 1 tablet by mouth 2 (two) times daily. 04/02/16   Consuello Lassalle L Warda Mcqueary, PA-C    Allergies Lidocaine hcl  Family History  Problem Relation Age of Onset  . Cancer Paternal Grandmother     Social History Social History  Substance Use Topics  . Smoking status: Never Smoker  . Smokeless tobacco: Never Used  . Alcohol use No     Review of Systems  Constitutional: No fever/chills Cardiovascular: No chest pain. Respiratory: No shortness of breath.  Gastrointestinal: No abdominal pain.  No nausea, vomiting.  Genitourinary: Negative for dysuria. No hematuria. No pelvic pain, vaginal discharge. No urinary hesitancy, urgency or increased frequency. Musculoskeletal: Negative for general myalgias.  Skin: Positive skin sore left labia with active oozing weeping. Negative for rash, redness.  ____________________________________________   PHYSICAL EXAM:  VITAL SIGNS: ED Triage Vitals  Enc Vitals Group     BP 04/02/16 1400 (!) 121/57     Pulse Rate 04/02/16 1400 74     Resp 04/02/16 1400 18     Temp 04/02/16 1400 97.6 F (36.4 C)     Temp Source 04/02/16 1400 Oral  SpO2 04/02/16 1400 100 %     Weight 04/02/16 1401 130 lb (59 kg)     Height 04/02/16 1401 5\' 2"  (1.575 m)     Head Circumference --      Peak Flow --      Pain Score 04/02/16 1402 7     Pain Loc --      Pain Edu? --      Excl. in GC? --      Constitutional: Alert and oriented. Well appearing and in no acute distress. Eyes: Conjunctivae are normal.  Head: Atraumatic. Hematological/Lymphatic/Immunilogical: No Inguinal lymphadenopathy. Cardiovascular: Good  peripheral circulation. Respiratory: Normal respiratory effort without tachypnea or retractions.  Genitourinary: 1 cm oblong area of indurated skin noted about the left labia with active oozing and weeping. No fluctuance. No overt erythema, abnormal warmth. Musculoskeletal: No lower extremity tenderness nor edema.  No joint effusions. Neurologic:  Normal speech and language. No gross focal neurologic deficits are appreciated.  Skin:  Skin is warm, dry. No rash noted. Psychiatric: Mood and affect are normal. Speech and behavior are normal. Patient exhibits appropriate insight and judgement.   ____________________________________________   LABS  None ____________________________________________  EKG  None ____________________________________________  RADIOLOGY  None ____________________________________________    PROCEDURES  Procedure(s) performed: None   Procedures   Medications  cefTRIAXone (ROCEPHIN) injection 500 mg (500 mg Intramuscular Given 04/02/16 1550)  sulfamethoxazole-trimethoprim (BACTRIM DS,SEPTRA DS) 800-160 MG per tablet 1 tablet (1 tablet Oral Given 04/02/16 1600)  ketorolac (TORADOL) 30 MG/ML injection 30 mg (30 mg Intramuscular Given 04/02/16 1551)     ____________________________________________   INITIAL IMPRESSION / ASSESSMENT AND PLAN / ED COURSE  Pertinent labs & imaging results that were available during my care of the patient were reviewed by me and considered in my medical decision making (see chart for details).  Clinical Course     Patient's diagnosis is consistent with labial abscess. Patient was given IM Rocephin and Toradol in the emergency department and tolerated well without side effects. Patient also given 2 tablets of Bactrim DS to take homer for her next 2 doses due to inclement weather and pharmacies being closed. Patient will be discharged home with prescriptions for Bactrim DS and naproxen to take as directed. Patient is to  follow up with [facility] in 2 days for recheck. Patient is given ED precautions to return to the ED for any worsening or new symptoms.    ____________________________________________  FINAL CLINICAL IMPRESSION(S) / ED DIAGNOSES  Final diagnoses:  Labial abscess      NEW MEDICATIONS STARTED DURING THIS VISIT:  Discharge Medication List as of 04/02/2016  3:33 PM    START taking these medications   Details  naproxen (NAPROSYN) 500 MG tablet Take 1 tablet (500 mg total) by mouth 2 (two) times daily with a meal., Starting Wed 04/02/2016, Print    sulfamethoxazole-trimethoprim (BACTRIM DS,SEPTRA DS) 800-160 MG tablet Take 1 tablet by mouth 2 (two) times daily., Starting Wed 04/02/2016, Print             Ernestene Kiel [place], PA-C 04/02/16 1746    Willy Eddy, MD 04/02/16 2012

## 2016-04-02 NOTE — ED Triage Notes (Signed)
Patient to ER for c/o abscess to left labia approx size of dime with drainage present.

## 2016-04-02 NOTE — ED Notes (Signed)
Pt verbalized understanding of discharge instructions. NAD at this time. 

## 2016-10-02 ENCOUNTER — Encounter: Payer: Self-pay | Admitting: Obstetrics and Gynecology

## 2016-10-02 ENCOUNTER — Ambulatory Visit (INDEPENDENT_AMBULATORY_CARE_PROVIDER_SITE_OTHER): Payer: Medicaid Other | Admitting: Obstetrics and Gynecology

## 2016-10-02 VITALS — BP 110/70 | HR 52 | Ht 62.0 in | Wt 145.0 lb

## 2016-10-02 DIAGNOSIS — Z30432 Encounter for removal of intrauterine contraceptive device: Secondary | ICD-10-CM

## 2016-10-02 DIAGNOSIS — Z30016 Encounter for initial prescription of transdermal patch hormonal contraceptive device: Secondary | ICD-10-CM

## 2016-10-02 MED ORDER — NORELGESTROMIN-ETH ESTRADIOL 150-35 MCG/24HR TD PTWK: 1.0000 | MEDICATED_PATCH | TRANSDERMAL | 1 refills | Status: DC

## 2016-10-02 NOTE — Progress Notes (Signed)
   Chief Complaint  Patient presents with  . iud removal     History of Present Illness:  Kristen Stevens is a 21 y.o. that had a Mirena IUD placed approximately 1 years ago. Since that time, she has had long periods, lasting about 3 wks, with increased cramping. She would like to try different non-daily BC. She does not want nexplanon. She is past due for annual.   BP 110/70   Pulse (!) 52   Ht 5\' 2"  (1.575 m)   Wt 145 lb (65.8 kg)   LMP 09/13/2016   BMI 26.52 kg/m   Pelvic exam:  Two IUD strings present seen coming from the cervical os. EGBUS, vaginal vault and cervix: within normal limits  IUD Removal Strings of IUD identified and grasped.  IUD removed without problem with ring forceps.  Pt tolerated this well.  IUD noted to be intact.  Assessment:  IUD Removal Encounter for IUD removal  Encounter for initial prescription of transdermal patch hormonal contraceptive device - BC options discussed. Pt wants to try xulane. Rx eRxd. 1 sample, handout given. RTO in 4 wks for annua/f/u. - Plan: norelgestromin-ethinyl estradiol [facility]r Medico(XULANE) 150-35 MCG/24HR transdermal patch  Meds ordered this encounter  Medications  . norelgestromin-ethinyl estradiol [employer]ico(XULANE) 150-35 MCG/24HR transdermal patch    Sig: Place 1 patch onto the skin once a week. Apply 1 patch weekly for 3 weeks, then 1 week without patch    Dispense:  1 Package    Refill:  1    Plan: IUD removed and plan for contraception is Office managerrtho-Evra. She was amenable to this plan.  Jaquel Glassburn B. Eben Choinski, PA-C 10/02/2016 10:44 AM

## 2016-10-20 ENCOUNTER — Telehealth: Payer: Self-pay

## 2016-10-20 NOTE — Telephone Encounter (Signed)
Pt states she is 100% positive that she has a UTI and requested rx to be sent in so that she does not have to go to urgent care. Left msg for pt stating that she needs to be seen in order to treat appropriately. Pt may also go to PCP or urgent care.

## 2016-11-12 ENCOUNTER — Ambulatory Visit: Payer: Self-pay | Admitting: Obstetrics and Gynecology

## 2016-11-12 ENCOUNTER — Encounter: Payer: Self-pay | Admitting: Obstetrics and Gynecology

## 2016-11-26 ENCOUNTER — Ambulatory Visit (INDEPENDENT_AMBULATORY_CARE_PROVIDER_SITE_OTHER): Payer: Medicaid Other | Admitting: Obstetrics and Gynecology

## 2016-11-26 ENCOUNTER — Encounter: Payer: Self-pay | Admitting: Obstetrics and Gynecology

## 2016-11-26 VITALS — BP 108/64 | HR 72 | Temp 97.8°F | Ht 62.0 in | Wt 148.0 lb

## 2016-11-26 DIAGNOSIS — R300 Dysuria: Secondary | ICD-10-CM

## 2016-11-26 DIAGNOSIS — Z3045 Encounter for surveillance of transdermal patch hormonal contraceptive device: Secondary | ICD-10-CM | POA: Diagnosis not present

## 2016-11-26 DIAGNOSIS — N3000 Acute cystitis without hematuria: Secondary | ICD-10-CM | POA: Diagnosis not present

## 2016-11-26 DIAGNOSIS — R350 Frequency of micturition: Secondary | ICD-10-CM | POA: Diagnosis not present

## 2016-11-26 LAB — POCT URINALYSIS DIPSTICK
Bilirubin, UA: NEGATIVE
GLUCOSE UA: NEGATIVE
Ketones, UA: NEGATIVE
Leukocytes, UA: NEGATIVE
NITRITE UA: POSITIVE
Protein, UA: NEGATIVE
Spec Grav, UA: >=1.03 — AB (ref 1.010–1.025)
UROBILINOGEN UA: 0.2 U/dL
pH, UA: 5 (ref 5.0–8.0)

## 2016-11-26 MED ORDER — NITROFURANTOIN MONOHYD MACRO 100 MG PO CAPS: 100.0000 mg | ORAL_CAPSULE | Freq: Two times a day (BID) | ORAL | 0 refills | Status: DC

## 2016-11-26 MED ORDER — NORELGESTROMIN-ETH ESTRADIOL 150-35 MCG/24HR TD PTWK: 1.0000 | MEDICATED_PATCH | TRANSDERMAL | 0 refills | Status: DC

## 2016-11-26 MED ORDER — NITROFURANTOIN MONOHYD MACRO 100 MG PO CAPS: 100.0000 mg | ORAL_CAPSULE | Freq: Two times a day (BID) | ORAL | 0 refills | Status: AC

## 2016-11-26 NOTE — Progress Notes (Signed)
Chief Complaint  Patient presents with  . Urinary Tract Infection    dysuria, frequency & odor x2wks    HPI:      Kristen Stevens is a 21 y.o. G1P1001 who LMP was Patient's last menstrual period was 11/25/2016., presents today for UTI sx for the past 2 wks. Pt has had dysuria, urgency, frequency, odor. No LBP, belly pain, fevers. No vag sx. She has a hx of kidney stones. She has a hx of UTIs in the past.  She started xulane this past month and is doing well with it. Her annual is 10/18 and needs 1 more month to get her to appt.    Past Medical History:  Diagnosis Date  . Kidney stones     Past Surgical History:  Procedure Laterality Date  . KIDNEY STONE SURGERY    . TONSILLECTOMY    . Tubes in ears      Family History  Problem Relation Age of Onset  . Cancer Paternal Grandmother 355       Breast    Social History   Social History  . Marital status: Single    Spouse name: N/A  . Number of children: 1  . Years of education: N/A   Occupational History  . Not on file.   Social History Main Topics  . Smoking status: Never Smoker  . Smokeless tobacco: Never Used  . Alcohol use Yes  . Drug use: No  . Sexual activity: Yes    Birth control/ protection: IUD   Other Topics Concern  . Not on file   Social History Narrative  . No narrative on file     Current Outpatient Prescriptions:  .  benzocaine-Menthol (DERMOPLAST) 20-0.5 % AERO, Apply 1 application topically as needed for irritation (perineal discomfort). (Patient not taking: Reported on 10/02/2016), Disp: , Rfl:  .  docusate sodium (COLACE) 100 MG capsule, Take 1 capsule (100 mg total) by mouth daily. (Patient not taking: Reported on 10/02/2016), Disp: 60 capsule, Rfl: 1 .  ferrous sulfate 325 (65 FE) MG tablet, Take 1 tablet (325 mg total) by mouth 3 (three) times daily with meals. (Patient not taking: Reported on 10/02/2016), Disp: 90 tablet, Rfl: 3 .  ibuprofen (ADVIL,MOTRIN) 600 MG tablet, Take 1  tablet (600 mg total) by mouth every 6 (six) hours as needed for mild pain, moderate pain or cramping. (Patient not taking: Reported on 10/02/2016), Disp: 30 tablet, Rfl: 0 .  naproxen (NAPROSYN) 500 MG tablet, Take 1 tablet (500 mg total) by mouth 2 (two) times daily with a meal. (Patient not taking: Reported on 10/02/2016), Disp: 14 tablet, Rfl: 0 .  nitrofurantoin, macrocrystal-monohydrate, (MACROBID) 100 MG capsule, Take 1 capsule (100 mg total) by mouth 2 (two) times daily., Disp: 14 capsule, Rfl: 0 .  norelgestromin-ethinyl estradiol Burr Medico(XULANE) 150-35 MCG/24HR transdermal patch, Place 1 patch onto the skin once a week. Apply 1 patch weekly for 3 weeks, then 1 week without patch, Disp: 1 Package, Rfl: 0 .  Prenatal Vit-Fe Fumarate-FA (MULTIVITAMIN-PRENATAL) 27-0.8 MG TABS tablet, Take 1 tablet by mouth daily at 12 noon., Disp: , Rfl:  .  sulfamethoxazole-trimethoprim (BACTRIM DS,SEPTRA DS) 800-160 MG tablet, Take 1 tablet by mouth 2 (two) times daily. (Patient not taking: Reported on 10/02/2016), Disp: 14 tablet, Rfl: 0   ROS:  Review of Systems  Constitutional: Negative for fever.  Gastrointestinal: Negative for blood in stool, constipation, diarrhea, nausea and vomiting.  Genitourinary: Positive for dysuria, frequency and urgency. Negative for  dyspareunia, flank pain, hematuria, vaginal bleeding, vaginal discharge and vaginal pain.  Musculoskeletal: Negative for back pain.  Skin: Negative for rash.     OBJECTIVE:   Vitals:  BP 108/64 (BP Location: Left Arm, Patient Position: Sitting, Cuff Size: Normal)   Pulse 72   Temp 97.8 F (36.6 C)   Ht  (1.575 m)   Wt 148 lb (67.1 kg)   LMP 11/25/2016   BMI 27.07 kg/m   Physical Exam  Constitutional: She is oriented to person, place, and time and well-developed, well-nourished, and in no distress.  Abdominal: There is no CVA tenderness.  Neurological: She is alert and oriented to person, place, and time.  Psychiatric: Affect and  judgment normal.  Vitals reviewed.   Results: Results for orders placed or performed in visit on 11/26/16 (from the past 24 hour(s))  POCT Urinalysis Dipstick     Status: Abnormal   Collection Time: 11/26/16  3:30 PM  Result Value Ref Range   Color, UA yellow    Clarity, UA clear    Glucose, UA neg    Bilirubin, UA neg    Ketones, UA neg    Spec Grav, UA >=1.030 (A) 1.010 - 1.025   Blood, UA +    pH, UA 5.0 5.0 - 8.0   Protein, UA neg    Urobilinogen, UA 0.2 0.2 or 1.0 E.U./dL   Nitrite, UA pos    Leukocytes, UA Negative Negative     Assessment/Plan: Acute cystitis without hematuria - Rx macrobid. Check C&S. F/u prn.  - Plan: Urine Culture, nitrofurantoin, macrocrystal-monohydrate, (MACROBID) 100 MG capsule  Dysuria - Plan: POCT Urinalysis Dipstick, Urine Culture, nitrofurantoin, macrocrystal-monohydrate, (MACROBID) 100 MG capsule  Urinary frequency - Plan: POCT Urinalysis Dipstick, Urine Culture, nitrofurantoin, macrocrystal-monohydrate, (MACROBID) 100 MG capsule  Encounter for surveillance of transdermal patch hormonal contraceptive device - BC RF till 10/18 annual. - Plan: norelgestromin-ethinyl estradiol Burr Medico) 150-35 MCG/24HR transdermal patch    Meds ordered this encounter  Medications  . nitrofurantoin, macrocrystal-monohydrate, (MACROBID) 100 MG capsule    Sig: Take 1 capsule (100 mg total) by mouth 2 (two) times daily.    Dispense:  14 capsule    Refill:  0  . norelgestromin-ethinyl estradiol Burr Medico) 150-35 MCG/24HR transdermal patch    Sig: Place 1 patch onto the skin once a week. Apply 1 patch weekly for 3 weeks, then 1 week without patch    Dispense:  1 Package    Refill:  0      Return if symptoms worsen or fail to improve.  Hrishikesh Hoeg B. Marsena Taff, PA-C 11/26/2016 3:37 PM

## 2016-11-26 NOTE — Addendum Note (Signed)
Addended by: Althea GrimmerOPLAND, Eduar Kumpf B on: 11/26/2016 03:39 PM   Modules accepted: Orders

## 2016-12-01 LAB — URINE CULTURE

## 2016-12-25 ENCOUNTER — Ambulatory Visit (INDEPENDENT_AMBULATORY_CARE_PROVIDER_SITE_OTHER): Payer: Medicaid Other | Admitting: Obstetrics and Gynecology

## 2016-12-25 ENCOUNTER — Encounter: Payer: Self-pay | Admitting: Obstetrics and Gynecology

## 2016-12-25 VITALS — BP 104/64 | HR 69 | Ht 61.0 in | Wt 146.0 lb

## 2016-12-25 DIAGNOSIS — N926 Irregular menstruation, unspecified: Secondary | ICD-10-CM

## 2016-12-25 DIAGNOSIS — Z124 Encounter for screening for malignant neoplasm of cervix: Secondary | ICD-10-CM

## 2016-12-25 DIAGNOSIS — Z309 Encounter for contraceptive management, unspecified: Secondary | ICD-10-CM

## 2016-12-25 DIAGNOSIS — Z3202 Encounter for pregnancy test, result negative: Secondary | ICD-10-CM | POA: Diagnosis not present

## 2016-12-25 DIAGNOSIS — Z803 Family history of malignant neoplasm of breast: Secondary | ICD-10-CM | POA: Insufficient documentation

## 2016-12-25 DIAGNOSIS — Z01419 Encounter for gynecological examination (general) (routine) without abnormal findings: Secondary | ICD-10-CM

## 2016-12-25 DIAGNOSIS — Z3045 Encounter for surveillance of transdermal patch hormonal contraceptive device: Secondary | ICD-10-CM

## 2016-12-25 DIAGNOSIS — Z113 Encounter for screening for infections with a predominantly sexual mode of transmission: Secondary | ICD-10-CM

## 2016-12-25 LAB — POCT URINE PREGNANCY: Preg Test, Ur: NEGATIVE

## 2016-12-25 MED ORDER — NORELGESTROMIN-ETH ESTRADIOL 150-35 MCG/24HR TD PTWK
MEDICATED_PATCH | TRANSDERMAL | 12 refills | Status: DC
Start: 2016-12-25 — End: 2021-06-13

## 2016-12-25 NOTE — Progress Notes (Signed)
PCP:  Patient, No Pcp Per   Chief Complaint  Patient presents with  . Gynecologic Exam     HPI:      Ms. Kristen Stevens is a 21 y.o. G1P1001 who LMP was Patient's last menstrual period was 11/25/2016., presents today for her annual examination.  Her menses are regular every 28-30 days, lasting 6 days.  Dysmenorrhea none. She does not have intermenstrual bleeding. She is late for menses this month on xulane. She is due to start new month of patches tomorrow.   Sex activity: single partner, contraception - Ortho-Evra patches weekly. Started Rx 7/18. Last Pap: never Hx of STDs: none  There is a FH of breast cancer in her PGM, pat aunt, mat grta unt and mat 1st cousin, genetic testing not done. There is no FH of ovarian cancer. The patient does do self-breast exams.  Tobacco use: The patient denies current or previous tobacco use. Alcohol use: none No drug use.  Exercise: moderately active  She does get adequate calcium and Vitamin D in her diet. Had [place] #1, but never completed series.    Past Medical History:  Diagnosis Date  . Kidney stones     Past Surgical History:  Procedure Laterality Date  . KIDNEY STONE SURGERY    . TONSILLECTOMY    . Tubes in ears      Family History  Problem Relation Age of Onset  . Breast cancer Paternal Grandmother 67  . Breast cancer Paternal Aunt 22  . Breast cancer Cousin 39  . Breast cancer Other 35    Social History   Social History  . Marital status: Single    Spouse name: N/A  . Number of children: 1  . Years of education: N/A   Occupational History  . Not on file.   Social History Main Topics  . Smoking status: Never Smoker  . Smokeless tobacco: Never Used  . Alcohol use Yes  . Drug use: No  . Sexual activity: Yes    Birth control/ protection: Patch   Other Topics Concern  . Not on file   Social History Narrative  . No narrative on file    Current Meds  Medication Sig  . norelgestromin-ethinyl  estradiol Burr Medico) 150-35 MCG/24HR transdermal patch Apply 1 patch weekly for 3 weeks, then 1 week without patch  . [DISCONTINUED] norelgestromin-ethinyl estradiol Burr Medico) 150-35 MCG/24HR transdermal patch Place 1 patch onto the skin once a week. Apply 1 patch weekly for 3 weeks, then 1 week without patch     ROS:  Review of Systems  Constitutional: Negative for fatigue, fever and unexpected weight change.  Respiratory: Negative for cough, shortness of breath and wheezing.   Cardiovascular: Negative for chest pain, palpitations and leg swelling.  Gastrointestinal: Negative for blood in stool, constipation, diarrhea, nausea and vomiting.  Endocrine: Negative for cold intolerance, heat intolerance and polyuria.  Genitourinary: Negative for dyspareunia, dysuria, flank pain, frequency, genital sores, hematuria, menstrual problem, pelvic pain, urgency, vaginal bleeding, vaginal discharge and vaginal pain.  Musculoskeletal: Negative for back pain, joint swelling and myalgias.  Skin: Negative for rash.  Neurological: Negative for dizziness, syncope, light-headedness, numbness and headaches.  Hematological: Negative for adenopathy.  Psychiatric/Behavioral: Negative for agitation, confusion, sleep disturbance and suicidal ideas. The patient is not nervous/anxious.      Objective: BP 104/64   Pulse 69   Ht  (1.549 m)   Wt 146 lb (66.2 kg)   LMP 11/25/2016   BMI 27.59 kg/m  Physical Exam  Constitutional: She is oriented to person, place, and time. She appears well-developed and well-nourished.  Genitourinary: Vagina normal and uterus normal. There is no rash or tenderness on the right labia. There is no rash or tenderness on the left labia. No erythema or tenderness in the vagina. No vaginal discharge found. Right adnexum does not display mass and does not display tenderness. Left adnexum does not display mass and does not display tenderness. Cervix does not exhibit motion tenderness  or polyp. Uterus is not enlarged or tender.  Neck: Normal range of motion. No thyromegaly present.  Cardiovascular: Normal rate, regular rhythm and normal heart sounds.   No murmur heard. Pulmonary/Chest: Effort normal and breath sounds normal. Right breast exhibits no mass, no nipple discharge, no skin change and no tenderness. Left breast exhibits no mass, no nipple discharge, no skin change and no tenderness.  Abdominal: Soft. There is no tenderness. There is no guarding.  Musculoskeletal: Normal range of motion.  Neurological: She is alert and oriented to person, place, and time. No cranial nerve deficit.  Psychiatric: She has a normal mood and affect. Her behavior is normal.  Vitals reviewed.   Results: Results for orders placed or performed in visit on 12/25/16 (from the past 24 hour(s))  POCT urine pregnancy     Status: Normal   Collection Time: 12/25/16 11:56 AM  Result Value Ref Range   Preg Test, Ur Negative Negative    Assessment/Plan: Encounter for annual routine gynecological examination  Cervical cancer screening - Plan: IGP,CtNgTv,rfx Aptima HPV ASCU  Screening for STD (sexually transmitted disease) - Plan: IGP,CtNgTv,rfx Aptima HPV ASCU  Encounter for surveillance of transdermal patch hormonal contraceptive device - RX RF xulane. - Plan: norelgestromin-ethinyl estradiol Burr Medico) 150-35 MCG/24HR transdermal patch  Late menses - NEg UPT. Reassurance. Cont xulane. - Plan: POCT urine pregnancy  Family history of breast cancer - MyRisk testing discussed and pt declines. F/u prn.   Meds ordered this encounter  Medications  . norelgestromin-ethinyl estradiol Burr Medico) 150-35 MCG/24HR transdermal patch    Sig: Apply 1 patch weekly for 3 weeks, then 1 week without patch    Dispense:  1 Package    Refill:  12             GYN counsel adequate intake of calcium and vitamin D, diet and exercise, Gardasil series     F/U  Return in about 1 year (around  12/25/2017).  Shakoya Gilmore B. Jia Mohamed, PA-C 12/25/2016 12:00 PM

## 2016-12-27 LAB — IGP,CTNGTV,RFX APTIMA HPV ASCU
CHLAMYDIA, NUC. ACID AMP: NEGATIVE
GONOCOCCUS, NUC. ACID AMP: NEGATIVE
PAP Smear Comment: 0
Trich vag by NAA: NEGATIVE

## 2017-01-23 ENCOUNTER — Telehealth: Payer: Self-pay

## 2017-01-23 NOTE — Telephone Encounter (Signed)
Pt is on xulane patch, is trying to plan a second child, took patch off Wed and hasn't started period yet, home preg test inconclusive.  Is confused.  [phone]  Adv isn't even a week late yet, to give it time, if hasn't started period by Wed next week to do another preg test and if neg to put patch on or leave off if ready to start trying, if hasn't started in a week and a half or so to be seen.

## 2017-02-06 ENCOUNTER — Encounter: Payer: Self-pay | Admitting: Emergency Medicine

## 2017-02-06 ENCOUNTER — Emergency Department
Admission: EM | Admit: 2017-02-06 | Discharge: 2017-02-06 | Disposition: A | Discharge status: Home / Self Care | Payer: Self-pay | Attending: Emergency Medicine | Admitting: Emergency Medicine

## 2017-02-06 DIAGNOSIS — Y929 Unspecified place or not applicable: Secondary | ICD-10-CM | POA: Insufficient documentation

## 2017-02-06 DIAGNOSIS — Y939 Activity, unspecified: Secondary | ICD-10-CM | POA: Insufficient documentation

## 2017-02-06 DIAGNOSIS — S63501A Unspecified sprain of right wrist, initial encounter: Secondary | ICD-10-CM | POA: Insufficient documentation

## 2017-02-06 DIAGNOSIS — X58XXXA Exposure to other specified factors, initial encounter: Secondary | ICD-10-CM | POA: Insufficient documentation

## 2017-02-06 DIAGNOSIS — Y999 Unspecified external cause status: Secondary | ICD-10-CM | POA: Insufficient documentation

## 2017-02-06 MED ORDER — NAPROXEN 500 MG PO TABS: 500.0000 mg | ORAL_TABLET | Freq: Two times a day (BID) | ORAL | 0 refills | Status: DC

## 2017-02-06 NOTE — Discharge Instructions (Signed)
Follow-up with Dr. Odis LusterBowers who is the orthopedist on call if any continued problems. You will need to call his office and make an appointment. Wear splint for one week. Begin taking naproxen 500 mg twice a day with food for inflammation and pain. You  may continue using ice when needed to reduce pain or swelling.

## 2017-02-06 NOTE — ED Triage Notes (Signed)
Patient presents to the ED stating [place] and was told her wrist was sprained.  Patient was instructed to wear a sling x 2 weeks to promote healing.  Patient states when she went to work today she was told she could not work without a note stating she was cleared to work.  Patient is in no obvious distress at this time.

## 2017-02-06 NOTE — ED Provider Notes (Signed)
[facility] Emergency Department Provider Note  ___________________________________________   First MD Initiated Contact with Patient 02/06/17 1754     (approximate)  I have reviewed the triage vital signs and the nursing notes.   HISTORY  Chief Complaint Letter for School/Work   HPI Kristen Stevens is a 21 y.o. female is here for low stating that she can return to work. Patient states that she was seen in [place] after she injured her wrist on and off while her. She was seen in the emergency room in [place] and told that she did not have a fracture. She was placed in a sling and given a brace to wear. She went to work this evening and was told that she could not work until she had a note stating that she was cleared to go to work. Patient has not been taking any over-the-counter medication.  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   Past Medical History:  Diagnosis Date  . Kidney stones     Patient Active Problem List   Diagnosis Date Noted  . Family history of breast cancer 12/25/2016  . Dysuria 11/26/2016  . Urinary frequency 11/26/2016  . Postpartum care following vaginal delivery 05/09/2015  . Post-dates pregnancy 05/06/2015  . Labor and delivery, indication for care 05/05/2015  . Abdominal pain affecting pregnancy 05/05/2015    Past Surgical History:  Procedure Laterality Date  . KIDNEY STONE SURGERY    . TONSILLECTOMY    . Tubes in ears      Prior to Admission medications   Medication Sig Start Date End Date Taking? Authorizing Provider  naproxen (NAPROSYN) 500 MG tablet Take 1 tablet (500 mg total) by mouth 2 (two) times daily with a meal. 02/06/17   Tommi RumpsSummers, Rhonda L, PA-C  norelgestromin-ethinyl estradiol Burr Medico(XULANE) 150-35 MCG/24HR transdermal patch Apply 1 patch weekly for 3 weeks, then 1 week without patch  12/25/16   Copland, Alicia B, PA-C    Allergies Peanut-containing drug products; Lidocaine hcl; and Peanut oil  Family History  Problem Relation Age of Onset  . Breast cancer Paternal Grandmother 4455  . Breast cancer Paternal Aunt 442  . Breast cancer Cousin 39  . Breast cancer Other 3948    Social History Social History   Tobacco Use  . Smoking status: Never Smoker  . Smokeless tobacco: Never Used  Substance Use Topics  . Alcohol use: Yes  . Drug use: No    Review of Systems Constitutional: No fever/chills Cardiovascular: Denies chest pain. Respiratory: Denies shortness of breath. Musculoskeletal: positive for right wrist pain. Skin: Negative for rash. Neurological: Negative for  focal weakness or numbness. ___________________________________________   PHYSICAL EXAM:  VITAL SIGNS: ED Triage Vitals [02/06/17 1721]  Enc Vitals Group     BP 109/74     Pulse Rate 73     Resp 16     Temp 98.7 F (37.1 C)     Temp Source Oral     SpO2 98 %     Weight 140 lb (63.5 kg)     Height 5\' 2"  (1.575 m)     Head Circumference      Peak Flow      Pain Score      Pain Loc      Pain Edu?      Excl. in GC?  Constitutional: Alert and oriented. Well appearing and in no acute distress. Eyes: Conjunctivae are normal. PERRL. EOMI. Head: Atraumatic. Neck: No stridor.   Cardiovascular: Normal rate, regular rhythm. Grossly normal heart sounds.  Good peripheral circulation. Respiratory: Normal respiratory effort.  No retractions. Lungs CTAB. Musculoskeletal: examination of the right wrist there is no gross deformity, no soft tissue swelling or ecchymosis. Patient is able to flex and extend without difficulty. Pulses present. Skin is intact. Capillary refill less than 3 seconds and motor sensory function intact. Neurologic:  Normal speech and language. No gross focal neurologic deficits are appreciated.  Skin:  Skin is warm, dry and intact. No ecchymosis, abrasions, erythema  present. Psychiatric: Mood and affect are normal. Speech and behavior are normal.  ____________________________________________   LABS (all labs ordered are listed, but only abnormal results are displayed)  Labs Reviewed - No data to display   PROCEDURES  Procedure(s) performed: None  Procedures  Critical Care performed: No  ____________________________________________   INITIAL IMPRESSION / ASSESSMENT AND PLAN / ED COURSE Patient was placed in a Velcro cockup wrist splint to give extra protection so she is unsure about lifting food trays. She was given a note stating that she would need to wear the cockup wrist splint for one week and no lifting over 25 pounds. Patient was given a prescription for naproxen 500 mg twice a day with food. She is to follow-up with Dr. Odis LusterBowers who is the orthopedist on call if any continued problems. ____________________________________________   FINAL CLINICAL IMPRESSION(S) / ED DIAGNOSES  Final diagnoses:  Right wrist sprain, initial encounter     ED Discharge Orders        Ordered    naproxen (NAPROSYN) 500 MG tablet  2 times daily with meals     02/06/17 1903       Note:  This document was prepared using Dragon voice recognition software and may include unintentional dictation errors.    Tommi RumpsSummers, Rhonda L, PA-C 02/06/17 1918    Merrily Brittleifenbark, Neil, MD 02/06/17 2055

## 2017-02-12 ENCOUNTER — Emergency Department
Admission: EM | Admit: 2017-02-12 | Discharge: 2017-02-12 | Disposition: A | Discharge status: Home / Self Care | Payer: Self-pay | Attending: Student in an Organized Health Care Education/Training Program | Admitting: Student in an Organized Health Care Education/Training Program

## 2017-02-12 DIAGNOSIS — Z79899 Other long term (current) drug therapy: Secondary | ICD-10-CM | POA: Insufficient documentation

## 2017-02-12 DIAGNOSIS — Z7689 Persons encountering health services in other specified circumstances: Secondary | ICD-10-CM | POA: Insufficient documentation

## 2017-02-12 DIAGNOSIS — Z91010 Allergy to peanuts: Secondary | ICD-10-CM | POA: Insufficient documentation

## 2017-02-12 NOTE — ED Provider Notes (Signed)
[facility] Emergency Department Provider Note  ____________________________________________  Time seen: Approximately 4:34 PM  I have reviewed the triage vital signs and the nursing notes.   HISTORY  Chief Complaint No chief complaint on file.    HPI Kristen Stevens is a 21 y.o. female patient has sustained a right wrist injury in [place], came to the emergency department a week ago for reevaluation.  Patient was required to wear wrist brace for 1 week and then she was able to return to work.  Patient returns to work and was informed that she was not able to return until she had a note specifying that she was cleared for full duty.  Patient denies any complaints at this time she is only requesting a note to return to work.  Past Medical History:  Diagnosis Date  . Kidney stones     Patient Active Problem List   Diagnosis Date Noted  . Family history of breast cancer 12/25/2016  . Dysuria 11/26/2016  . Urinary frequency 11/26/2016  . Postpartum care following vaginal delivery 05/09/2015  . Post-dates pregnancy 05/06/2015  . Labor and delivery, indication for care 05/05/2015  . Abdominal pain affecting pregnancy 05/05/2015    Past Surgical History:  Procedure Laterality Date  . KIDNEY STONE SURGERY    . TONSILLECTOMY    . Tubes in ears      Prior to Admission medications   Medication Sig Start Date End Date Taking? Authorizing Provider  naproxen (NAPROSYN) 500 MG tablet Take 1 tablet (500 mg total) by mouth 2 (two) times daily with a meal. 02/06/17   Tommi RumpsSummers, Rhonda L, PA-C  norelgestromin-ethinyl estradiol Burr Medico(XULANE) 150-35 MCG/24HR transdermal patch Apply 1 patch weekly for 3 weeks, then 1 week without patch 12/25/16   Copland, Alicia B, PA-C    Allergies Peanut-containing drug products; Lidocaine hcl; and Peanut oil  Family History  Problem Relation Age of Onset  . Breast cancer Paternal Grandmother 6355  . Breast cancer Paternal Aunt 5142  .  Breast cancer Cousin 39  . Breast cancer Other 4648    Social History Social History   Tobacco Use  . Smoking status: Never Smoker  . Smokeless tobacco: Never Used  Substance Use Topics  . Alcohol use: Yes  . Drug use: No     Review of Systems  Constitutional: No fever/chills Eyes: No visual changes. No discharge ENT: No upper respiratory complaints. Cardiovascular: no chest pain. Respiratory: no cough. No SOB. Gastrointestinal: No abdominal pain.  No nausea, no vomiting.  Musculoskeletal: Negative for musculoskeletal pain. Skin: Negative for rash, abrasions, lacerations, ecchymosis. Neurological: Negative for headaches, focal weakness or numbness. 10-point ROS otherwise negative.  ____________________________________________   PHYSICAL EXAM:  VITAL SIGNS: ED Triage Vitals  Enc Vitals Group     BP      Pulse      Resp      Temp      Temp src      SpO2      Weight      Height      Head Circumference      Peak Flow      Pain Score      Pain Loc      Pain Edu?      Excl. in GC?      Constitutional: Alert and oriented. Well appearing and in no acute distress. Eyes: Conjunctivae are normal. PERRL. EOMI. Head: Atraumatic. Neck: No stridor.    Cardiovascular: Normal rate, regular rhythm.  Normal S1 and S2.  Good peripheral circulation. Respiratory: Normal respiratory effort without tachypnea or retractions. Lungs CTAB. Good air entry to the bases with no decreased or absent breath sounds. Musculoskeletal: Full range of motion to all extremities. No gross deformities appreciated.  Wrist has no visible gross abnormalities.  Full range of motion.  Nontender to palpation.  Sensation and cap refill intact all 5 digits. Neurologic:  Normal speech and language. No gross focal neurologic deficits are appreciated.  Skin:  Skin is warm, dry and intact. No rash noted. Psychiatric: Mood and affect are normal. Speech and behavior are normal. Patient exhibits appropriate insight  and judgement.   ____________________________________________   LABS (all labs ordered are listed, but only abnormal results are displayed)  Labs Reviewed - No data to display ____________________________________________  EKG   ____________________________________________  RADIOLOGY   No results found.  ____________________________________________    PROCEDURES  Procedure(s) performed:    Procedures    Medications - No data to display   ____________________________________________   INITIAL IMPRESSION / ASSESSMENT AND PLAN / ED COURSE  Pertinent labs & imaging results that were available during my care of the patient were reviewed by me and considered in my medical decision making (see chart for details).  Review of the [place] CSRS was performed in accordance of the [facility] prior to dispensing any controlled drugs.     Patient's diagnosis is consistent with return to work note.  Patient was seen in this department a week ago and given a note for 1 week.  Patient was returning to work and told that she cannot return until she had a note specifically stating that she was cleared.  No complaints at this time.  Note given to patient to return to work. Patient is given ED precautions to return to the ED for any worsening or new symptoms.     ____________________________________________  FINAL CLINICAL IMPRESSION(S) / ED DIAGNOSES  Final diagnoses:  Return to work evaluation      NEW MEDICATIONS STARTED DURING THIS VISIT:  ED Discharge Orders    None          This chart was dictated using voice recognition software/Dragon. Despite best efforts to proofread, errors can occur which can change the meaning. Any change was purely unintentional.    Racheal PatchesCuthriell, Jonathan D, PA-C 02/12/17 1647    Willy Eddyobinson, Patrick, MD 02/12/17 [phone]

## 2017-02-12 NOTE — ED Notes (Signed)
First Nurse Note:  Patient's occupation will not let her return back to work until she has a note stating that she is past her 7 day >25 lb lifting restriction.

## 2018-06-28 ENCOUNTER — Encounter: Payer: Self-pay | Admitting: Emergency Medicine

## 2018-06-28 ENCOUNTER — Other Ambulatory Visit: Payer: Self-pay

## 2018-06-28 ENCOUNTER — Emergency Department
Admission: EM | Admit: 2018-06-28 | Discharge: 2018-06-28 | Disposition: A | Discharge status: Home / Self Care | Payer: Self-pay | Attending: Emergency Medicine | Admitting: Emergency Medicine

## 2018-06-28 DIAGNOSIS — S93602A Unspecified sprain of left foot, initial encounter: Secondary | ICD-10-CM | POA: Insufficient documentation

## 2018-06-28 DIAGNOSIS — X58XXXA Exposure to other specified factors, initial encounter: Secondary | ICD-10-CM | POA: Insufficient documentation

## 2018-06-28 DIAGNOSIS — M84375A Stress fracture, left foot, initial encounter for fracture: Secondary | ICD-10-CM | POA: Insufficient documentation

## 2018-06-28 DIAGNOSIS — Y929 Unspecified place or not applicable: Secondary | ICD-10-CM | POA: Insufficient documentation

## 2018-06-28 DIAGNOSIS — Z91010 Allergy to peanuts: Secondary | ICD-10-CM | POA: Insufficient documentation

## 2018-06-28 DIAGNOSIS — Y939 Activity, unspecified: Secondary | ICD-10-CM | POA: Insufficient documentation

## 2018-06-28 DIAGNOSIS — Z79899 Other long term (current) drug therapy: Secondary | ICD-10-CM | POA: Insufficient documentation

## 2018-06-28 DIAGNOSIS — Y999 Unspecified external cause status: Secondary | ICD-10-CM | POA: Insufficient documentation

## 2018-06-28 NOTE — ED Notes (Signed)
See triage note.  Presents with pain to lateral aspect of left foot  Stats pain started on Saturday  Became worse yesterday  Denies any injury  Good pulses

## 2018-06-28 NOTE — ED Triage Notes (Signed)
Pt here with c/o pain in her left foot, denies injury to area, walked with limp to triage.

## 2018-06-28 NOTE — Discharge Instructions (Signed)
Your exam is concerning for a stress fracture versus a foot sprain. Wear the post-op shoe when walking for comfort. Use the crutches to bear weight as tolerated. Rest, ice and elevate the foot when seated. Consider warm epsom salt soaks as needed. Take OTC ibuprofen for inflammation relief. Follow-up with Dr. Orland Jarred for ongoing symptoms, and further evaluation.

## 2018-06-28 NOTE — ED Provider Notes (Signed)
[facility] Emergency Department Provider Note ____________________________________________  Time seen: 1146  I have reviewed the triage vital signs and the nursing notes.  HISTORY  Chief Complaint  Foot Pain  HPI Kristen Stevens is a 23 y.o. female presents result to the ED for evaluation of 3 to 4-day complaint of left foot pain.  Patient denies any outright injury, accident, trauma, or fall.  She reports pain to the lateral aspect of the left foot.  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  She does admit to some increased walking over the last several days.  She also has been running daily for exercise.  She denies any history of pre-existing foot or ankle problems.  She reports her last 2 mornings she had increased pain upon awakening with bearing weight through the feet.  She has been walking on her heel primarily.  She denies taking any medications in the interim for symptom relief.  Past Medical History:  Diagnosis Date  . Kidney stones     Patient Active Problem List   Diagnosis Date Noted  . Family history of breast cancer 12/25/2016  . Dysuria 11/26/2016  . Urinary frequency 11/26/2016  . Postpartum care following vaginal delivery 05/09/2015  . Post-dates pregnancy 05/06/2015  . Labor and delivery, indication for care 05/05/2015  . Abdominal pain affecting pregnancy 05/05/2015    Past Surgical History:  Procedure Laterality Date  . KIDNEY STONE SURGERY    . TONSILLECTOMY    . Tubes in ears      Prior to Admission medications   Medication Sig Start Date End Date Taking? Authorizing Provider  norelgestromin-ethinyl estradiol Burr Medico) 150-35 MCG/24HR transdermal patch Apply 1 patch weekly for 3 weeks, then 1 week without patch 12/25/16   Copland, Alicia B, PA-C    Allergies Peanut-containing drug products; Lidocaine hcl; and Peanut oil  Family History  Problem Relation Age of Onset  . Breast cancer  Paternal Grandmother 31  . Breast cancer Paternal Aunt 105  . Breast cancer Cousin 39  . Breast cancer Other 51    Social History Social History   Tobacco Use  . Smoking status: Never Smoker  . Smokeless tobacco: Never Used  Substance Use Topics  . Alcohol use: Yes  . Drug use: No    Review of Systems  Constitutional: Negative for fever. Cardiovascular: Negative for chest pain. Respiratory: Negative for shortness of breath. Musculoskeletal: Negative for back pain. Skin: Negative for rash.  Foot pain as above. Neurological: Negative for headaches, focal weakness or numbness. ____________________________________________  PHYSICAL EXAM:  VITAL SIGNS: ED Triage Vitals  Enc Vitals Group     BP 06/28/18 1133 113/79     Pulse Rate 06/28/18 1133 81     Resp 06/28/18 1133 14     Temp 06/28/18 1133 98.2 F (36.8 C)     Temp Source 06/28/18 1133 Oral     SpO2 06/28/18 1133 100 %     Weight 06/28/18 1129 160 lb (72.6 kg)     Height 06/28/18 1129 5\' 2"  (1.575 m)     Head Circumference --      Peak Flow --      Pain Score 06/28/18 1129 8     Pain Loc --      Pain Edu? --      Excl. in GC? --     Constitutional: Alert and oriented. Well appearing and in no distress. Head: Normocephalic and atraumatic.  Eyes: Conjunctivae are normal. Normal extraocular movements Cardiovascular: Normal rate, regular rhythm. Normal distal pulses. Respiratory: Normal respiratory effort. No wheezes/rales/rhonchi. Gastrointestinal: Soft and nontender. No distention. Musculoskeletal: Left foot without any obvious deformity or dislocation.  No significant erythema, edema, or effusion noted to the left foot or ankle.  Patient with tenderness to palpation to the base of the fifth meta tarsal.  Ankle exam is benign at this time.  No significant calf or Achilles tenderness is elicited.  Nontender with normal range of motion in all extremities.  Neurologic: Mildly antalgic gait without ataxia. Normal speech  and language. No gross focal neurologic deficits are appreciated. Skin:  Skin is warm, dry and intact. No rash noted. Psychiatric: Mood and affect are normal. Patient exhibits appropriate insight and judgment. ____________________________________________   RADIOLOGY  Deferred ____________________________________________  PROCEDURES  Procedures Crutches Postop shoe ____________________________________________  INITIAL IMPRESSION / ASSESSMENT AND PLAN / ED COURSE  Patient with ED evaluation of left foot pain without known injury.  Patient reports lateral foot pain and disability over the last several days.  Her exam is concerning for possible foot sprain versus a stress fracture.  She is placed in a postop shoe for comfort.  She is referred to podiatry for ongoing symptom management.  She will take over-the-counter ibuprofen for pain and inflammation relief.  She is referred to podiatry for ongoing symptom management. ____________________________________________  FINAL CLINICAL IMPRESSION(S) / ED DIAGNOSES  Final diagnoses:  Sprain of left foot, initial encounter  Stress fracture of left foot, initial encounter      Lissa HoardMenshew, Mykaila Blunck V Bacon, PA-C 06/28/18 1531    Emily FilbertWilliams, Jonathan E, MD 06/29/18 [phone]

## 2020-05-24 ENCOUNTER — Encounter: Payer: Self-pay | Admitting: Obstetrics and Gynecology

## 2020-05-31 ENCOUNTER — Encounter: Payer: Self-pay | Admitting: Obstetrics and Gynecology

## 2020-06-05 ENCOUNTER — Encounter: Payer: Medicaid Other | Admitting: Obstetrics and Gynecology

## 2020-06-12 ENCOUNTER — Encounter: Payer: Self-pay | Admitting: Obstetrics and Gynecology

## 2021-03-17 NOTE — L&D Delivery Note (Signed)
Delivery Note  Date of delivery: 10/04/2021 Estimated Date of Delivery: 10/10/21 Patient's last menstrual period was 01/03/2021. EGA: [redacted]w[redacted]d  Delivery Note At 9:41 PM a viable female was delivered via Vaginal, Spontaneous (Presentation: LOA).  APGAR: 7, 9; weight 4010g.  Placenta status: Spontaneous, Intact.  Cord: 3 vessels with the following complications: None.    First Stage: Labor onset: unknown Augmentation : Pitocin, AROM Analgesia Eliezer Lofts intrapartum: Epidural  AROM at 0840  Kristen Stevens presented to L&D for an IOL. She was inducted with cytotec, pitocin and AROM. Epidural placed for pain relief.   Second Stage: Complete dilation at 2056 Onset of pushing at 2056 FHR second stage Cat II Delivery at 2141 on 10/04/2021  She progressed to complete and had a spontaneous vaginal birth of a live female over an intact perineum. The fetal head was delivered in OA position with restitution to LOA. No nuchal cord. Shoulder dystocia x1 min, maneuvers used - pulling the legs out straight, followed very quickly by McRoberts and suprapubic pressure. Anterior then posterior shoulders delivered assistance. Baby placed on mom's abdomen and attended to by transition RN. Cord clamped and cut immediately for evaluation by transition and NNP. Cord blood obtained for newborn labs.  Third Stage: Placenta delivered 2206 intact with 3VC at 2206 Placenta disposition: routine disposal  Uterine tone firm / bleeding min IV pitocin given for hemorrhage prophylaxis  Anesthesia: Epidural Episiotomy: None Lacerations: 2nd degree;Vaginal;Perineal Suture Repair: 2.0 vicryl Est. Blood Loss (mL): 200  Complications: shoulder dystocia  Mom to postpartum.  Baby to Couplet care / Skin to Skin.  Newborn: Birth Weight: 4010g  Apgar Scores: 7, 9 Feeding planned: breastfeeding   Cyril Mourning, CNM 10/04/2021 10:25 PM

## 2021-03-22 DIAGNOSIS — Z3482 Encounter for supervision of other normal pregnancy, second trimester: Secondary | ICD-10-CM | POA: Insufficient documentation

## 2021-04-16 ENCOUNTER — Other Ambulatory Visit: Payer: Medicaid Other

## 2021-06-12 ENCOUNTER — Other Ambulatory Visit: Payer: Self-pay

## 2021-06-12 ENCOUNTER — Observation Stay
Admission: EM | Admit: 2021-06-12 | Discharge: 2021-06-13 | Disposition: A | Discharge status: Home / Self Care | Payer: Medicaid Other | Attending: Obstetrics and Gynecology | Admitting: Obstetrics and Gynecology

## 2021-06-12 DIAGNOSIS — Z91010 Allergy to peanuts: Secondary | ICD-10-CM | POA: Diagnosis not present

## 2021-06-12 DIAGNOSIS — N132 Hydronephrosis with renal and ureteral calculous obstruction: Secondary | ICD-10-CM | POA: Diagnosis not present

## 2021-06-12 DIAGNOSIS — E669 Obesity, unspecified: Secondary | ICD-10-CM | POA: Diagnosis not present

## 2021-06-12 DIAGNOSIS — Z6836 Body mass index (BMI) 36.0-36.9, adult: Secondary | ICD-10-CM | POA: Diagnosis not present

## 2021-06-12 DIAGNOSIS — O99212 Obesity complicating pregnancy, second trimester: Secondary | ICD-10-CM | POA: Insufficient documentation

## 2021-06-12 DIAGNOSIS — Z3A23 23 weeks gestation of pregnancy: Secondary | ICD-10-CM | POA: Insufficient documentation

## 2021-06-12 DIAGNOSIS — O26832 Pregnancy related renal disease, second trimester: Secondary | ICD-10-CM | POA: Diagnosis not present

## 2021-06-12 DIAGNOSIS — M549 Dorsalgia, unspecified: Secondary | ICD-10-CM | POA: Diagnosis present

## 2021-06-12 DIAGNOSIS — Z20822 Contact with and (suspected) exposure to covid-19: Secondary | ICD-10-CM | POA: Diagnosis not present

## 2021-06-12 LAB — URINALYSIS, ROUTINE W REFLEX MICROSCOPIC
Bilirubin Urine: NEGATIVE
Glucose, UA: NEGATIVE mg/dL
Ketones, ur: 20 mg/dL — AB
Leukocytes,Ua: NEGATIVE
Nitrite: NEGATIVE
Protein, ur: 30 mg/dL — AB
RBC / HPF: >50 RBC/hpf — ABNORMAL HIGH (ref 0–5)
Specific Gravity, Urine: 1.02 (ref 1.005–1.030)
pH: 6 (ref 5.0–8.0)

## 2021-06-12 LAB — RESP PANEL BY RT-PCR (FLU A&B, COVID) ARPGX2
Influenza A by PCR: NEGATIVE
Influenza B by PCR: NEGATIVE
SARS Coronavirus 2 by RT PCR: NEGATIVE

## 2021-06-12 LAB — WET PREP, GENITAL
Clue Cells Wet Prep HPF POC: NONE SEEN
Sperm: NONE SEEN
Trich, Wet Prep: NONE SEEN
WBC, Wet Prep HPF POC: <10 (ref <10)
Yeast Wet Prep HPF POC: NONE SEEN

## 2021-06-12 MED ORDER — ACETAMINOPHEN 500 MG PO TABS
1000.0000 mg | ORAL_TABLET | Freq: Once | ORAL | Status: AC
  Administered 2021-06-12: 1000 mg via ORAL
  Filled 2021-06-12: qty 2

## 2021-06-12 MED ORDER — ZOLPIDEM TARTRATE 5 MG PO TABS: 5.0000 mg | ORAL_TABLET | Freq: Every evening | ORAL | Status: DC | PRN

## 2021-06-12 MED ORDER — CYCLOBENZAPRINE HCL 5 MG PO TABS
5.0000 mg | ORAL_TABLET | Freq: Once | ORAL | Status: AC
  Administered 2021-06-12: 5 mg via ORAL
  Filled 2021-06-12: qty 1

## 2021-06-12 MED ORDER — TAMSULOSIN HCL 0.4 MG PO CAPS
0.4000 mg | ORAL_CAPSULE | Freq: Once | ORAL | Status: AC
  Administered 2021-06-12: 0.4 mg via ORAL
  Filled 2021-06-12: qty 1

## 2021-06-12 NOTE — OB Triage Note (Signed)
Pt Kristen Stevens 26 y.o. presents to labor and delivery triage reporting significant low back pain 8/10. Pt is a G1P1001 at [redacted]w[redacted]d. Pt denies signs and symptoms consistent with rupture of membranes or active vaginal bleeding. Pt denies contractions and states positive fetal movement. External FM and TOCO applied to non-tender abdomen and assessing. Initial FHR 150. Vital signs obtained and within normal limits. Provider notified of pt. ? ?

## 2021-06-13 ENCOUNTER — Observation Stay: Payer: Medicaid Other

## 2021-06-13 DIAGNOSIS — O26832 Pregnancy related renal disease, second trimester: Secondary | ICD-10-CM | POA: Diagnosis not present

## 2021-06-13 MED ORDER — SODIUM CHLORIDE 0.9 % IV SOLN
1.0000 g | INTRAVENOUS | Status: DC
  Administered 2021-06-13: 1 g via INTRAVENOUS
  Filled 2021-06-13: qty 10

## 2021-06-13 MED ORDER — LACTATED RINGERS IV SOLN: INTRAVENOUS | Status: DC

## 2021-06-13 MED ORDER — CEPHALEXIN 500 MG PO CAPS: 500.0000 mg | ORAL_CAPSULE | Freq: Four times a day (QID) | ORAL | 0 refills | Status: AC

## 2021-06-13 MED ORDER — BUTORPHANOL TARTRATE 1 MG/ML IJ SOLN
1.0000 mg | INTRAMUSCULAR | Status: DC | PRN
  Administered 2021-06-13 (×2): 1 mg via INTRAVENOUS
  Filled 2021-06-13 (×2): qty 1

## 2021-06-13 NOTE — Progress Notes (Signed)
Patient continues to sit on the floor and refuse all medical interventions, stating that she does not want to take any medication and risk losing her baby. She was reassured several times by this provider that she would not be given any medication that would cause a loss. Patient continues to thrive in pain on the floor with fetal monitors off declining intervention. ? ?Chari Manning CNM ?

## 2021-06-13 NOTE — Progress Notes (Signed)
Kristen Stevens is a 26 y.o. female. She is at [redacted]w[redacted]d gestation. Patient's last menstrual period was 01/03/2021. ?Estimated Date of Delivery: 10/10/21 ? ?Prenatal care site: [facility] ? ?Chief complaint: low back pain ? ?HPI: Kristen Stevens presents to L&D with complaints of low back pain  that she rates a 8/10. Patient describes pain as colicky and intermittent. She was seen in the clinic yesterday for a routine [facility] visit and was evaluated for kidney stones. Her history is significant for kidney stones in childhood with stent placement. A renal US was performed and sent for interpretation. Patient was sent home with Flomax and flexeril, both of which she did not take stating she does not want to take any medications, and is reluctant even with tylenol. She denies any LOF, VB and endorses fetal movement. ? ?Factors complicating pregnancy: ?Peanut allergy with Epipen ?Lidocaine allergy ?Obesity ?Rubella non-immune ? ?S: Resting comfortably. no CTX, no VB.no LOF,  Active fetal movement.  ? ?Maternal Medical History:  ?Past Medical Hx:  has a past medical history of Kidney stones.   ? ?Past Surgical Hx:  has a past surgical history that includes Tonsillectomy; Tubes in ears; and Kidney stone surgery.  ? ?Allergies  ?Allergen Reactions  ? Peanut-Containing Drug Products Anaphylaxis  ?  Other reaction(s): SWELLING  ? Lidocaine Hcl Swelling  ? Peanut Oil Hives  ?  ? ?Prior to Admission medications   ?Medication Sig Start Date End Date Taking? Authorizing Provider  ?norelgestromin-ethinyl estradiol Marilu Favre) 150-35 MCG/24HR transdermal patch Apply 1 patch weekly for 3 weeks, then 1 week without patch [id number]   Copland, Deirdre Evener, PA-C  ? ? ?Social History: She  reports that she has never smoked. She has never used smokeless tobacco. She reports current alcohol use. She reports that she does not use drugs. ? ?Family History: family history includes Breast cancer (age of onset: 24) in her cousin; Breast cancer (age of  onset: 33) in her paternal aunt; Breast cancer (age of onset: 2) in an other family member; Breast cancer (age of onset: 63) in her paternal grandmother. ,no history of gyn cancers ? ?Review of Systems: A full review of systems was performed and negative except as noted in the HPI.   ? ?O: ? BP 107/61 (BP Location: Left Arm)   Pulse 88   Temp 98.9 ?F (37.2 ?C) (Oral)   Resp (!) 22   Ht 5\' 2"  (1.575 m)   Wt 90.7 kg   LMP 01/03/2021   BMI 36.58 kg/m?  ?Results for orders placed or performed during the hospital encounter of 06/12/21 (from the past 48 hour(s))  ?Resp Panel by RT-PCR (Flu A&B, Covid) Nasopharyngeal Swab  ? Collection Time: 06/12/21 10:20 PM  ? Specimen: Nasopharyngeal Swab; Nasopharyngeal(NP) swabs in vial transport medium  ?Result Value Ref Range  ? SARS Coronavirus 2 by RT PCR NEGATIVE NEGATIVE  ? Influenza A by PCR NEGATIVE NEGATIVE  ? Influenza B by PCR NEGATIVE NEGATIVE  ?Wet prep, genital  ? Collection Time: 06/12/21 10:42 PM  ? Specimen: Urine, Clean Catch  ?Result Value Ref Range  ? Yeast Wet Prep HPF POC NONE SEEN NONE SEEN  ? Trich, Wet Prep NONE SEEN NONE SEEN  ? Clue Cells Wet Prep HPF POC NONE SEEN NONE SEEN  ? WBC, Wet Prep HPF POC <10 <10  ? Sperm NONE SEEN   ?Urinalysis, Routine w reflex microscopic Urine, Clean Catch  ? Collection Time: 06/12/21 10:42 PM  ?Result Value Ref Range  ?  Color, Urine YELLOW (A) YELLOW  ? APPearance HAZY (A) CLEAR  ? Specific Gravity, Urine 1.020 1.005 - 1.030  ? pH 6.0 5.0 - 8.0  ? Glucose, UA NEGATIVE NEGATIVE mg/dL  ? Hgb urine dipstick LARGE (A) NEGATIVE  ? Bilirubin Urine NEGATIVE NEGATIVE  ? Ketones, ur 20 (A) NEGATIVE mg/dL  ? Protein, ur 30 (A) NEGATIVE mg/dL  ? Nitrite NEGATIVE NEGATIVE  ? Leukocytes,Ua NEGATIVE NEGATIVE  ? RBC / HPF >50 (H) 0 - 5 RBC/hpf  ? WBC, UA 11-20 0 - 5 WBC/hpf  ? Bacteria, UA MANY (A) NONE SEEN  ? Squamous Epithelial / LPF 0-5 0 - 5  ? Mucus PRESENT   ?  ? ? ?Constitutional: NAD, AAOx3  ?HE/ENT: extraocular movements  grossly intact, moist mucous membranes ?CV: RRR ?PULM: nl respiratory effort ?Abd: gravid, non-tender, non-distended, soft  ?Ext: Non-tender, Nonedmeatous ?Psych: mood appropriate, speech normal ?Pelvic : deferred ?SVE:    ? ? ? ? ?Assessment: 27 y.o. [redacted]w[redacted]d here for antenatal surveillance during pregnancy. Patient now in bed with mother at bedside. She is agreeable to IV fluids, pain meds and repeat renal US ? ?Principle diagnosis: Back pain affecting pregnancy in second trimester [O99.891, M54.9]  ? ?Plan: ?IVPM ?IV fluids ?Renal US ? ?----- ?Avelino Leeds CNM ?Certified Nurse Midwife ?[facility] ?[facility]  ?  ?

## 2021-06-13 NOTE — Discharge Summary (Signed)
Kristen Stevens is a 26 y.o. female. She is at [redacted]w[redacted]d gestation. Patient's last menstrual period was 01/03/2021. ?Estimated Date of Delivery: 10/10/21 ? ?Prenatal care site: [facility]  ? ?Current pregnancy complicated by:  ?Hx kidney stones and stents as a child.  ?Obesity, BMI 36 ?Rubella Non-Immune ?LIdocaine allergy ? ?Chief complaint: severe flank and back pain, eval on 06/11/21 for new onset of sx, UA with hematuria, renal US performed and pt referred to Urology on 06/11/21; Rx Flomax and Flexeril, prn zofran. Pt did not start meds as rx and presented overnight in L&D triage with severe pain. Initially refused IV fluids and pain medication, then did agree to take IVPM and Flomax PO.  ? ? ?S: Resting comfortably. no CTX, no VB.no LOF,  Active fetal movement. Denies: HA, visual changes, SOB, or RUQ/epigastric pain ? ?Maternal Medical History:  ? ?Past Medical History:  ?Diagnosis Date  ? Kidney stones   ? ? ?Past Surgical History:  ?Procedure Laterality Date  ? KIDNEY STONE SURGERY    ? TONSILLECTOMY    ? Tubes in ears    ? ? ?Allergies  ?Allergen Reactions  ? Peanut-Containing Drug Products Anaphylaxis  ?  Other reaction(s): SWELLING  ? Lidocaine Hcl Swelling  ? Peanut Oil Hives  ? ? ?Prior to Admission medications   ?Medication Sig Start Date End Date Taking? Authorizing Provider  ?norelgestromin-ethinyl estradiol Burr Medico) 150-35 MCG/24HR transdermal patch Apply 1 patch weekly for 3 weeks, then 1 week without patch 12/25/16   Copland, Ilona Sorrel, PA-C  ? ? ? ? ?Social History: She  reports that she has never smoked. She has never used smokeless tobacco. She reports current alcohol use. She reports that she does not use drugs. ? ?Family History: family history includes Breast cancer (age of onset: 54) in her cousin; Breast cancer (age of onset: 101) in her paternal aunt; Breast cancer (age of onset: 53) in an other family member; Breast cancer (age of onset: 33) in her paternal grandmother.  ? ?Review of  Systems: A full review of systems was performed and negative except as noted in the HPI.   ? ?O: ? BP 107/61 (BP Location: Left Arm)   Pulse 88   Temp 98.9 ?F (37.2 ?C) (Oral)   Resp (!) 22   Ht 5\' 2"  (1.575 m)   Wt 90.7 kg   LMP 01/03/2021   BMI 36.58 kg/m?  ?Results for orders placed or performed during the hospital encounter of 06/12/21 (from the past 48 hour(s))  ?Resp Panel by RT-PCR (Flu A&B, Covid) Nasopharyngeal Swab  ? Collection Time: 06/12/21 10:20 PM  ? Specimen: Nasopharyngeal Swab; Nasopharyngeal(NP) swabs in vial transport medium  ?Result Value Ref Range  ? SARS Coronavirus 2 by RT PCR NEGATIVE NEGATIVE  ? Influenza A by PCR NEGATIVE NEGATIVE  ? Influenza B by PCR NEGATIVE NEGATIVE  ?Wet prep, genital  ? Collection Time: 06/12/21 10:42 PM  ? Specimen: Urine, Clean Catch  ?Result Value Ref Range  ? Yeast Wet Prep HPF POC NONE SEEN NONE SEEN  ? Trich, Wet Prep NONE SEEN NONE SEEN  ? Clue Cells Wet Prep HPF POC NONE SEEN NONE SEEN  ? WBC, Wet Prep HPF POC <10 <10  ? Sperm NONE SEEN   ?Urinalysis, Routine w reflex microscopic Urine, Clean Catch  ? Collection Time: 06/12/21 10:42 PM  ?Result Value Ref Range  ? Color, Urine YELLOW (A) YELLOW  ? APPearance HAZY (A) CLEAR  ? Specific Gravity, Urine 1.020  1.005 - 1.030  ? pH 6.0 5.0 - 8.0  ? Glucose, UA NEGATIVE NEGATIVE mg/dL  ? Hgb urine dipstick LARGE (A) NEGATIVE  ? Bilirubin Urine NEGATIVE NEGATIVE  ? Ketones, ur 20 (A) NEGATIVE mg/dL  ? Protein, ur 30 (A) NEGATIVE mg/dL  ? Nitrite NEGATIVE NEGATIVE  ? Leukocytes,Ua NEGATIVE NEGATIVE  ? RBC / HPF >50 (H) 0 - 5 RBC/hpf  ? WBC, UA 11-20 0 - 5 WBC/hpf  ? Bacteria, UA MANY (A) NONE SEEN  ? Squamous Epithelial / LPF 0-5 0 - 5  ? Mucus PRESENT   ?  ? ? ?Constitutional: NAD, AAOx3  ?HE/ENT: extraocular movements grossly intact, moist mucous membranes ?CV: RRR ?PULM: nl respiratory effort, CTABL     ?Abd: gravid, non-tender, non-distended, soft      ?Ext: Non-tender, Nonedematous   ?Psych: mood  appropriate, speech normal ?Pelvic: deferred ? ?Fetal  monitoring: Doppler 150s, pt refuses continuous toco monitoring.  ? ? ?A/P: 26 y.o. [redacted]w[redacted]d here for antenatal surveillance for Right hydronephrosis, flank pain and nephrolithiasis.  ? ?Principle Diagnosis:  23wks, Right hydronephrosis, flank pain and nephrolithiasis.  ? ?No indication of PTL.  ?Consult with urology- Single dose of Rocephin IV started, Rx keflex for complete course. No indication for further imaging or surgery at this time. Pt to have outpt f/u with Urology.  ?Fetal Wellbeing: active FM and normal FHR via doppler ?Encouraged PO hydration, to complete course of Flomax, prn flexeril and tylenol for pain.  ?D/c home stable, precautions reviewed, follow-up as scheduled.  ? ? ?Randa Ngo, CNM ?06/13/2021  ?10:11 AM ? ?

## 2021-06-13 NOTE — Discharge Summary (Signed)
RN reviewed discharge instructions with patient. Gave patient opportunity for questions. All questions answered at this time. Pt verbalized understanding. Pt discharged home. 

## 2021-06-13 NOTE — Progress Notes (Addendum)
Kristen Stevens is a 26 y.o. female. She is at [redacted]w[redacted]d gestation. Patient's last menstrual period was 01/03/2021. ?Estimated Date of Delivery: 10/10/21 ? ?Prenatal care site: [facility] ? ?Chief complaint: low back pain  ? ?HPI: Kristen Stevens presents to L&D with complaints of low back pain  that she rates a 8/10. Patient describes pain as colicky and intermittent. She was seen in the clinic yesterday for a routine [facility] visit and was evaluated for kidney stones. Her history is significant for kidney stones in childhood with stent placement. A renal US was performed and sent for interpretation.  A Urology consult placed, and patient was sent home with Flomax and flexeril, both of which she did not take stating she does not want to take any medications, and is reluctant even with tylenol. She denies any LOF, VB and endorses fetal movement. ? ?Factors complicating pregnancy: ?Peanut allergy with Epipen ?Lidocaine allergy ?Obesity ?Rubella non-immune ? ?S: Resting comfortably. no CTX, no VB.no LOF,  Active fetal movement.  ? ?Maternal Medical History:  ?Past Medical Hx:  has a past medical history of Kidney stones.   ? ?Past Surgical Hx:  has a past surgical history that includes Tonsillectomy; Tubes in ears; and Kidney stone surgery.  ? ?Allergies  ?Allergen Reactions  ? Peanut-Containing Drug Products Anaphylaxis  ?  Other reaction(s): SWELLING  ? Lidocaine Hcl Swelling  ? Peanut Oil Hives  ?  ? ?Prior to Admission medications   ?Medication Sig Start Date End Date Taking? Authorizing Provider  ?norelgestromin-ethinyl estradiol Burr Medico) 150-35 MCG/24HR transdermal patch Apply 1 patch weekly for 3 weeks, then 1 week without patch 12/25/16   Copland, Ilona Sorrel, PA-C  ? ? ?Social History: She  reports that she has never smoked. She has never used smokeless tobacco. She reports current alcohol use. She reports that she does not use drugs. ? ?Family History: family history includes Breast cancer (age of onset: 66) in  her cousin; Breast cancer (age of onset: 13) in her paternal aunt; Breast cancer (age of onset: 80) in an other family member; Breast cancer (age of onset: 19) in her paternal grandmother. ,no history of gyn cancers ? ?Review of Systems: A full review of systems was performed and negative except as noted in the HPI.   ? ?O: ? BP 107/61 (BP Location: Left Arm)   Pulse 88   Temp 98.9 ?F (37.2 ?C) (Oral)   Resp (!) 22   Ht 5\' 2"  (1.575 m)   Wt 90.7 kg   LMP 01/03/2021   BMI 36.58 kg/m?  ?Results for orders placed or performed during the hospital encounter of 06/12/21 (from the past 48 hour(s))  ?Resp Panel by RT-PCR (Flu A&B, Covid) Nasopharyngeal Swab  ? Collection Time: 06/12/21 10:20 PM  ? Specimen: Nasopharyngeal Swab; Nasopharyngeal(NP) swabs in vial transport medium  ?Result Value Ref Range  ? SARS Coronavirus 2 by RT PCR NEGATIVE NEGATIVE  ? Influenza A by PCR NEGATIVE NEGATIVE  ? Influenza B by PCR NEGATIVE NEGATIVE  ?Wet prep, genital  ? Collection Time: 06/12/21 10:42 PM  ? Specimen: Urine, Clean Catch  ?Result Value Ref Range  ? Yeast Wet Prep HPF POC NONE SEEN NONE SEEN  ? Trich, Wet Prep NONE SEEN NONE SEEN  ? Clue Cells Wet Prep HPF POC NONE SEEN NONE SEEN  ? WBC, Wet Prep HPF POC <10 <10  ? Sperm NONE SEEN   ?Urinalysis, Routine w reflex microscopic Urine, Clean Catch  ? Collection Time: 06/12/21 10:42  PM  ?Result Value Ref Range  ? Color, Urine YELLOW (A) YELLOW  ? APPearance HAZY (A) CLEAR  ? Specific Gravity, Urine 1.020 1.005 - 1.030  ? pH 6.0 5.0 - 8.0  ? Glucose, UA NEGATIVE NEGATIVE mg/dL  ? Hgb urine dipstick LARGE (A) NEGATIVE  ? Bilirubin Urine NEGATIVE NEGATIVE  ? Ketones, ur 20 (A) NEGATIVE mg/dL  ? Protein, ur 30 (A) NEGATIVE mg/dL  ? Nitrite NEGATIVE NEGATIVE  ? Leukocytes,Ua NEGATIVE NEGATIVE  ? RBC / HPF >50 (H) 0 - 5 RBC/hpf  ? WBC, UA 11-20 0 - 5 WBC/hpf  ? Bacteria, UA MANY (A) NONE SEEN  ? Squamous Epithelial / LPF 0-5 0 - 5  ? Mucus PRESENT   ?  ? ? ?Constitutional: NAD, AAOx3   ?HE/ENT: extraocular movements grossly intact, moist mucous membranes ?CV: RRR ?PULM: nl respiratory effort ?Abd: gravid, non-tender, non-distended, soft  ?Ext: Non-tender, Nonedmeatous ?Psych: mood appropriate, speech normal ?Pelvic : deferred ?SVE:    ? ?Fetal Monitor: ?FHR 145 ?Patient removed monitors and stated they were causing increased discomfort. ? ? ?Assessment: 26 y.o. [redacted]w[redacted]d here for antenatal surveillance during pregnancy for sever low back pain that is colicky in nature and 8/10 on pain scale. Patient is visibly uncomfortable, now sitting on the floor and urinated on herself stating the pain was too bad to move and go to the bathroom. I offered patient IV pain medication, which she refused. After further discussion, patient agreed for me to start her on Flomax and Flexeril and accepted Tylenol. Another [place] was offered declined at this time.  ? ?Principle diagnosis: Back pain affecting pregnancy in second trimester [O99.891, M54.9]  ? ?Plan: ?Doppler ?Initiate Flomax, and Flexeril ?Pain medication ?UA similar to prior results yesterday ?Wet prep negative ? ? ?----- ?Chari Manning CNM ?Certified Nurse Midwife ?[facility] ?[facility]  ? ? ?

## 2021-06-14 LAB — URINE CULTURE: Culture: NO GROWTH

## 2021-06-17 ENCOUNTER — Ambulatory Visit (INDEPENDENT_AMBULATORY_CARE_PROVIDER_SITE_OTHER): Payer: Medicaid Other | Admitting: Urology

## 2021-06-17 ENCOUNTER — Encounter: Payer: Self-pay | Admitting: Urology

## 2021-06-17 VITALS — BP 118/77 | HR 93 | Ht 62.0 in | Wt 202.0 lb

## 2021-06-17 DIAGNOSIS — R319 Hematuria, unspecified: Secondary | ICD-10-CM

## 2021-06-17 DIAGNOSIS — N23 Unspecified renal colic: Secondary | ICD-10-CM

## 2021-06-17 DIAGNOSIS — N200 Calculus of kidney: Secondary | ICD-10-CM

## 2021-06-17 LAB — URINALYSIS, COMPLETE
Bilirubin, UA: NEGATIVE
Glucose, UA: NEGATIVE
Ketones, UA: NEGATIVE
Leukocytes,UA: NEGATIVE
Nitrite, UA: NEGATIVE
Protein,UA: NEGATIVE
Specific Gravity, UA: 1.025 (ref 1.005–1.030)
Urobilinogen, Ur: 0.2 mg/dL (ref 0.2–1.0)
pH, UA: 6.5 (ref 5.0–7.5)

## 2021-06-17 LAB — MICROSCOPIC EXAMINATION

## 2021-06-17 NOTE — Progress Notes (Signed)
? ?06/17/2021 ?12:58 PM  ? ?Kristen Stevens ?Jul 30, 1995 ?[id number] ? ?Referring provider: Doristine Bosworth, MD ?[address] ?[address] [place] [phone] ?Mail Stop [phone] ?[place],  [place] [address] ? ?Chief Complaint  ?Patient presents with  ? Hematuria  ? ? ?HPI: ?Kristen Stevens is a 26 y.o. female presents for evaluation of renal colic. ? ?[redacted] weeks gestation with estimated delivery date 10/10/2021 ?Seen OB 3/29 with complaints of low back pain rated 8/10 ?UA with microhematuria ?Renal ultrasound performed 06/13/2021 with a 4 mm right lower pole renal calculus.  Mild hydronephrosis but no definite hydroureter.  She did have bilateral ureteral jets ?Presently her pain has only been mild. ?She was prescribed tamsulosin however states this makes her drowsy and she is unable to work ?Prior history of recurrent stone disease with previous ureteroscopy and stent placement at [facility] ages [age] ? ?PMH: ?Past Medical History:  ?Diagnosis Date  ? Kidney stones   ? ? ?Surgical History: ?Past Surgical History:  ?Procedure Laterality Date  ? KIDNEY STONE SURGERY    ? TONSILLECTOMY    ? Tubes in ears    ? ? ?Home Medications:  ?Allergies as of 06/17/2021   ? ?   Reactions  ? Peanut-containing Drug Products Anaphylaxis  ? Other reaction(s): SWELLING  ? Lidocaine Hcl Swelling  ? Peanut Oil Hives  ? ?  ? ?  ?Medication List  ?  ? ?  ? Accurate as of June 17, 2021 12:58 PM. If you have any questions, ask your nurse or doctor.  ?  ?  ? ?  ? ?cephALEXin 500 MG capsule ?Commonly known as: KEFLEX ?Take 1 capsule (500 mg total) by mouth 4 (four) times daily for 10 days. ?  ?cyclobenzaprine 5 MG tablet ?Commonly known as: FLEXERIL ?Take 5 mg by mouth 3 (three) times daily as needed. ?  ?tamsulosin 0.4 MG Caps capsule ?Commonly known as: FLOMAX ?Take 0.4 mg by mouth daily. ?  ? ?  ? ? ?Allergies:  ?Allergies  ?Allergen Reactions  ? Peanut-Containing Drug Products Anaphylaxis  ?  Other reaction(s): SWELLING  ? Lidocaine Hcl Swelling  ?  Peanut Oil Hives  ? ? ?Family History: ?Family History  ?Problem Relation Age of Onset  ? Breast cancer Paternal Grandmother 58  ? Breast cancer Paternal Aunt 51  ? Breast cancer Cousin 21  ? Breast cancer Other 48  ? ? ?Social History:  reports that she has never smoked. She has never used smokeless tobacco. She reports current alcohol use. She reports that she does not use drugs. ? ? ?Physical Exam: ?BP 118/77   Pulse 93   Ht 5\' 2"  (1.575 m)   Wt 202 lb (91.6 kg)   LMP 01/03/2021   BMI 36.95 kg/m?   ?Constitutional:  Alert and oriented, No acute distress. ?HEENT: [place] AT, moist mucus membranes.  Trachea midline, no masses. ?Cardiovascular: No clubbing, cyanosis, or edema. ?Respiratory: Normal respiratory effort, no increased work of breathing. ?Psychiatric: Normal mood and affect. ? ?Laboratory Data: ? ?Urinalysis ?Dipstick trace blood/microscopy negative ? ? ?Pertinent Imaging: ?Images were personally reviewed and interpreted ? ?01/05/2021 RENAL ? ?Narrative ?CLINICAL DATA:  Right flank pain. ? ?EXAM: ?RENAL / URINARY TRACT ULTRASOUND COMPLETE ? ?COMPARISON:  None. ? ?FINDINGS: ?Right Kidney: ? ?Renal measurements: 11.2 x 6.1 x 5.8 cm = volume: 207 mL. Mild ?hydronephrosis evident. 4 mm stone identified lower pole right ?kidney. No discernible hydroureter. ? ?Left Kidney: ? ?Renal measurements: 10.8 x 5.8  x 5.0 cm = volume: 165 mL. ?Echogenicity within normal limits. No mass or hydronephrosis ?visualized. ? ?Bladder: ? ?Appears normal for degree of bladder distention. Color Doppler ?evaluation confirms bilateral ureteral jets. ? ?Other: ? ?None. ? ?IMPRESSION: ?Mild right hydronephrosis without discernible hydroureter by ?ultrasound. There is a nonobstructing 4 mm stone in the lower pole ?right kidney. ? ? ?Electronically Signed ?By: Kennith Center M.D. ?On: 06/13/2021 07:29 ? ? ?Assessment & Plan:   ? ?1.  Right nephrolithiasis ?Probable right ureteral calculus in addition to the 4 mm nonobstructing stone ?As long as  no recurrent severe pain initial recommendation would be observation ?If she continues to have recurrent pain we discussed options of stent placement versus percutaneous nephrostomy.  We discussed the increased risk of premature labor with anesthesia though a percutaneous nephrostomy could be performed under sedation ?She has opted for observation for now and will call for any worsening pain ?If no recurrent severe pain recommend follow-up stone protocol CT after delivery ? ? ?Riki Altes, MD ? ?[facility] ?[address] ?[place], [place] [address] ?[phone] ?[place] Senior ?

## 2021-06-19 ENCOUNTER — Encounter: Payer: Self-pay | Admitting: Urology

## 2021-06-25 ENCOUNTER — Inpatient Hospital Stay: Admission: RE | Admit: 2021-06-25 | Payer: BC Managed Care – PPO | Source: Ambulatory Visit

## 2021-06-26 ENCOUNTER — Ambulatory Visit: Payer: Medicaid Other | Admitting: Urology

## 2021-07-04 ENCOUNTER — Observation Stay
Admission: EM | Admit: 2021-07-04 | Discharge: 2021-07-05 | Disposition: A | Discharge status: Home / Self Care | Payer: Medicaid Other | Attending: Obstetrics | Admitting: Obstetrics

## 2021-07-04 ENCOUNTER — Other Ambulatory Visit: Payer: Self-pay

## 2021-07-04 ENCOUNTER — Observation Stay: Payer: Medicaid Other

## 2021-07-04 ENCOUNTER — Encounter: Payer: Self-pay | Admitting: Obstetrics and Gynecology

## 2021-07-04 DIAGNOSIS — M549 Dorsalgia, unspecified: Secondary | ICD-10-CM | POA: Diagnosis not present

## 2021-07-04 DIAGNOSIS — R10A1 Flank pain, right side: Secondary | ICD-10-CM | POA: Diagnosis present

## 2021-07-04 DIAGNOSIS — O99891 Other specified diseases and conditions complicating pregnancy: Secondary | ICD-10-CM | POA: Diagnosis present

## 2021-07-04 DIAGNOSIS — O26832 Pregnancy related renal disease, second trimester: Secondary | ICD-10-CM | POA: Diagnosis not present

## 2021-07-04 DIAGNOSIS — R109 Unspecified abdominal pain: Principal | ICD-10-CM | POA: Diagnosis present

## 2021-07-04 DIAGNOSIS — O99212 Obesity complicating pregnancy, second trimester: Secondary | ICD-10-CM | POA: Diagnosis present

## 2021-07-04 DIAGNOSIS — Z79899 Other long term (current) drug therapy: Secondary | ICD-10-CM | POA: Insufficient documentation

## 2021-07-04 DIAGNOSIS — N132 Hydronephrosis with renal and ureteral calculous obstruction: Secondary | ICD-10-CM | POA: Diagnosis present

## 2021-07-04 DIAGNOSIS — N1330 Unspecified hydronephrosis: Secondary | ICD-10-CM | POA: Diagnosis not present

## 2021-07-04 DIAGNOSIS — O26892 Other specified pregnancy related conditions, second trimester: Secondary | ICD-10-CM | POA: Diagnosis present

## 2021-07-04 DIAGNOSIS — Z3A26 26 weeks gestation of pregnancy: Secondary | ICD-10-CM | POA: Insufficient documentation

## 2021-07-04 DIAGNOSIS — N200 Calculus of kidney: Secondary | ICD-10-CM | POA: Diagnosis not present

## 2021-07-04 LAB — CBC
HCT: 31.7 % — ABNORMAL LOW (ref 36.0–46.0)
Hemoglobin: 10.4 g/dL — ABNORMAL LOW (ref 12.0–15.0)
MCH: 26.1 pg (ref 26.0–34.0)
MCHC: 32.8 g/dL (ref 30.0–36.0)
MCV: 79.4 fL — ABNORMAL LOW (ref 80.0–100.0)
Platelets: 216 10*3/uL (ref 150–400)
RBC: 3.99 MIL/uL (ref 3.87–5.11)
RDW: 14.6 % (ref 11.5–15.5)
WBC: 15.4 10*3/uL — ABNORMAL HIGH (ref 4.0–10.5)
nRBC: 0 % (ref 0.0–0.2)

## 2021-07-04 LAB — COMPREHENSIVE METABOLIC PANEL
ALT: 13 U/L (ref 0–44)
AST: 18 U/L (ref 15–41)
Albumin: 2.9 g/dL — ABNORMAL LOW (ref 3.5–5.0)
Alkaline Phosphatase: 109 U/L (ref 38–126)
Anion gap: 10 (ref 5–15)
BUN: 6 mg/dL (ref 6–20)
CO2: 19 mmol/L — ABNORMAL LOW (ref 22–32)
Calcium: 9 mg/dL (ref 8.9–10.3)
Chloride: 109 mmol/L (ref 98–111)
Creatinine, Ser: 0.43 mg/dL — ABNORMAL LOW (ref 0.44–1.00)
GFR, Estimated: >60 mL/min (ref >60)
Glucose, Bld: 141 mg/dL — ABNORMAL HIGH (ref 70–99)
Potassium: 3.5 mmol/L (ref 3.5–5.1)
Sodium: 138 mmol/L (ref 135–145)
Total Bilirubin: 0.6 mg/dL (ref 0.3–1.2)
Total Protein: 6.3 g/dL — ABNORMAL LOW (ref 6.5–8.1)

## 2021-07-04 MED ORDER — PRENATAL MULTIVITAMIN CH: 1.0000 | ORAL_TABLET | Freq: Every day | ORAL | Status: DC

## 2021-07-04 MED ORDER — DOCUSATE SODIUM 100 MG PO CAPS: 100.0000 mg | ORAL_CAPSULE | Freq: Every day | ORAL | Status: DC

## 2021-07-04 MED ORDER — LACTATED RINGERS IV SOLN: INTRAVENOUS | Status: DC

## 2021-07-04 MED ORDER — ACETAMINOPHEN 325 MG PO TABS: 650.0000 mg | ORAL_TABLET | ORAL | Status: DC | PRN

## 2021-07-04 MED ORDER — MORPHINE SULFATE (PF) 4 MG/ML IV SOLN
4.0000 mg | INTRAVENOUS | Status: AC | PRN
  Administered 2021-07-04: 4 mg via INTRAVENOUS
  Filled 2021-07-04: qty 1

## 2021-07-04 MED ORDER — CALCIUM CARBONATE ANTACID 500 MG PO CHEW: 2.0000 | CHEWABLE_TABLET | ORAL | Status: DC | PRN

## 2021-07-04 NOTE — Progress Notes (Signed)
FACULTY PRACTICE ANTEPARTUM COMPREHENSIVE PROGRESS NOTE ? ?Kristen Stevens is a 26 y.o. G2P1001 at [redacted]w[redacted]d who is admitted for kidney stone with colicky right flank pain.  Estimated Date of Delivery: 10/10/21 ? ?Length of Stay:  0 Days. Admitted 07/04/2021 ? ?Subjective: ?She states the pain has decreased to 5/10 but is still noticeable and more painful than usual. The pain becomes worse when she attempts to void. The pain is only in her right flank. ? ?Patient reports good fetal movement.  She denies uterine contractions, no bleeding and no loss of fluid per vagina. ? ?Vitals:  Blood pressure 114/69, pulse 91, temperature 98 ?F (36.7 ?C), temperature source Oral, resp. rate 18, last menstrual period 01/03/2021. ?Physical Examination: ?CONSTITUTIONAL: Well-developed, well-nourished female in no acute distress.  ?HENT:  Normocephalic, atraumatic, External right and left ear normal. Oropharynx is clear and moist ?EYES: Conjunctivae and EOM are normal. Pupils are equal, round, and reactive to light. No scleral icterus.  ?NECK: Normal range of motion, supple, no masses ?SKIN: Skin is warm and dry. No rash noted. Not diaphoretic. No erythema. No pallor. ?[place]: Alert and oriented to person, place, and time. Normal reflexes, muscle tone coordination. No cranial nerve deficit noted. ?PSYCHIATRIC: Normal mood and affect. Normal behavior. Normal judgment and thought content. ?CARDIOVASCULAR: Normal heart rate noted, regular rhythm ?RESPIRATORY: Effort and breath sounds normal, no problems with respiration noted ?MUSCULOSKELETAL: Normal range of motion. No edema and no tenderness. 2+ distal pulses. ?ABDOMEN: Soft, nontender, nondistended, gravid. ?CERVIX:   ? ?Fetal monitoring: FHR: 150 bpm, Variability: moderate, Accelerations: Present, Decelerations: Absent  ?Uterine activity: 0 contractions per hour ? ?No results found for this or any previous visit (from the past 48 hour(s)). ? ?US RENAL ? ?Result Date:  07/04/2021 ?CLINICAL DATA:  Renal calculi, pregnant EXAM: RENAL / URINARY TRACT ULTRASOUND COMPLETE COMPARISON:  06/13/2021 FINDINGS: Right Kidney: Renal measurements: 11.7 x 6.0 x 6.5 cm = volume: 238.2 mL. Stable mild right hydronephrosis. Nonobstructing 7 mm calculus lower pole right kidney. No renal mass. Echotexture within normal limits. Left Kidney: Renal measurements: 11.4 x 5.7 x 4.9 cm = volume: 170.3 mL. Echogenicity within normal limits. No mass or hydronephrosis visualized. Bladder: Bladder is decompressed as the patient voided prior to the exam. Other: Partial visualization of a gravid uterus. IMPRESSION: 1. Stable mild right hydronephrosis and nonobstructing right renal calculus. 2. Unremarkable left kidney. 3. Decompressed bladder limiting evaluation. Electronically Signed   By: Randa Ngo M.D.   On: 07/04/2021 17:09   ? ?Current scheduled medications ? docusate sodium  100 mg Oral Daily  ? [START ON 07/05/2021] prenatal multivitamin  1 tablet Oral Q1200  ? ? ?I have reviewed the patient's current medications. ? ?ASSESSMENT: ?Patient Active Problem List  ? Diagnosis Date Noted  ? Acute right flank pain 07/04/2021  ? Kidney stone complicating pregnancy, second trimester 07/04/2021  ? Back pain affecting pregnancy in second trimester 06/12/2021  ? Encounter for supervision of other normal pregnancy, second trimester 03/22/2021  ? Family history of breast cancer 12/25/2016  ? Dysuria 11/26/2016  ? Urinary frequency 11/26/2016  ? Postpartum care following vaginal delivery 05/09/2015  ? Post-dates pregnancy 05/06/2015  ? Labor and delivery, indication for care 05/05/2015  ? Abdominal pain affecting pregnancy 05/05/2015  ? ? ?PLAN: ? ?- Continue inpatient pain management over night ?- Urology to evaluate her in the morning ?- Continuous tocometer over night ?- Continuous IV fluids overnight ?- NPO at midnight ? ?Gertie Fey, CNM ?07/04/2021 ?8:54 PM ? ?

## 2021-07-04 NOTE — Progress Notes (Signed)
At bedside to place PIV per consult.  Pt not in room.  RN states pt is in ultrasound at this time.  RN to place IV consult upon arrival back to room. ?

## 2021-07-04 NOTE — OB Triage Note (Deleted)
Kristen Stevens is a 26 y.o. female. She is at [redacted]w[redacted]d gestation. Patient's last menstrual period was 01/03/2021. ?Estimated Date of Delivery: 10/10/21 ? ?Prenatal care site: [facility]  ? ?Current pregnancy complicated by:  ?Obesity ?Kidney stone ?Rubella non-immune ? ?Chief complaint: She was sent over from the office for severe colicky flank right flank pain radiating to her abdomen. She states the pain has been worsening for 3 days, today is severe and she cannot get comfortable. She is unsure if she is having contractions or if the kidney stone has moved.  ? ?She had a UA yesterday in the office which was WNL, no blood present. ? ?In the office today, she had a speculum exam which showed cervix closed and thick.  ? ?Wet prep and GC/CL in the office today negative. ? ?She saw urology 06/17/2021 and they recommended if pain is not severe, continue observation. If pain becomes severe again, she should be evaluated for the necessity of nephrostomy vs stent placement. ? ?S: Resting more comfortably. She states the pain is still present but has improved since she was seen in the office. no CTX, no VB.no LOF,  Active fetal movement.   ?Denies: HA, visual changes, SOB, or RUQ/epigastric pain ? ?Maternal Medical History:  ? ?Past Medical History:  ?Diagnosis Date  ? Kidney stones   ? ? ?Past Surgical History:  ?Procedure Laterality Date  ? KIDNEY STONE SURGERY    ? TONSILLECTOMY    ? Tubes in ears    ? ? ?Allergies  ?Allergen Reactions  ? Peanut-Containing Drug Products Anaphylaxis  ?  Other reaction(s): SWELLING  ? Lidocaine Hcl Swelling  ? Peanut Oil Hives  ? ? ?Prior to Admission medications   ?Medication Sig Start Date End Date Taking? Authorizing Provider  ?Prenatal Vit-Fe Fumarate-FA (MULTIVITAMIN-PRENATAL) 27-0.8 MG TABS tablet Take 1 tablet by mouth daily at 12 noon.   Yes [provider]  ?cyclobenzaprine (FLEXERIL) 5 MG tablet Take 5 mg by mouth 3 (three) times daily as needed. ?Patient not  taking: Reported on 07/04/2021 06/11/21   [provider]  ?tamsulosin (FLOMAX) 0.4 MG CAPS capsule Take 0.4 mg by mouth daily. ?Patient not taking: Reported on 07/04/2021 06/11/21   [provider]  ? ? ?Social History: She  reports that she has never smoked. She has never used smokeless tobacco. She reports current alcohol use. She reports that she does not use drugs. ? ?Family History: family history includes Breast cancer (age of onset: 39) in her cousin; Breast cancer (age of onset: 42) in her paternal aunt; Breast cancer (age of onset: 48) in an other family member; Breast cancer (age of onset: 55) in her paternal grandmother.  no history of gyn cancers ? ?Review of Systems: A full review of systems was performed and negative except as noted in the HPI.   ? ? ?O: ? BP 114/69 (BP Location: Left Arm)   Pulse 91   Temp 98 ?F (36.7 ?C) (Oral)   Resp 18   LMP 01/03/2021  ?No results found for this or any previous visit (from the past 48 hour(s)).  ? ?Constitutional: NAD, AAOx3  ?HE/ENT: extraocular movements grossly intact, moist mucous membranes ?CV: RRR ?PULM: nl respiratory effort, CTABL     ?Abd: gravid, non-tender, non-distended, soft      ?Ext: Non-tender, Nonedematous   ?Psych: mood appropriate, speech normal ?Pelvic: deferred (SSE in the office showed cervix closed and thick) ? ?Fetal  monitoring: Cat 1 Appropriate for GA ?  Baseline: 150bpm ?Variability: moderate ?Accelerations: present x >2 ?Decelerations absent ? ?Kidney US: 07/04/21 ?1. Stable mild right hydronephrosis and nonobstructing right renal ?calculus. ?2. Unremarkable left kidney. ?3. Decompressed bladder limiting evaluation. ? ?A/P: 26 y.o. [redacted]w[redacted]d here for antenatal surveillance for right flank pain, abdominal pain ? ?Principle Diagnosis:  High risk pregnancy in third trimester ? ?Labor: not present. Will continue monitoring with continuous tocometer to ensure no contractions. Abdomen palpates soft.  ?Fetal Wellbeing: Reassuring  Cat 1 tracing. ?Kidney US appears unchanged from 06/13/21 US. ?Notified Dr. Sninsky with urology that pt is here and is seeming to have worsening kidney stone symptoms. Per Dr. Sninsky, if pain improves, she can follow-up outpatient as needed. If pain continues or worsens, she may need a low-dose CT scan and his service will evaluate her in the morning and she will be made NPO at midnight.  ?IVPM Morphine 4mg IV q2hrs PRN pain  ?Continuous IV fluids ?Prolonged monitoring for at least 4-6 hours. If pain resolves, consider discharging for outpatient follow-up. If pain continues or worsens, admit overnight for pain medication, continued monitoring, r/o PTL, and urology evaluation.  ?Dr. Schermerhorn aware and agreeable with POC.  ? ?Jamielynn Wigley Renee Jaishon Krisher, CNM ?07/04/2021 ?5:59 PM ? ?

## 2021-07-04 NOTE — H&P (Signed)
Kristen Stevens is a 26 y.o. female. She is at [redacted]w[redacted]d gestation. Patient's last menstrual period was 01/03/2021. ?Estimated Date of Delivery: 10/10/21 ? ?Prenatal care site: [facility]  ? ?Current pregnancy complicated by:  ?Obesity ?Kidney stone ?Rubella non-immune ? ?Chief complaint: She was sent over from the office for severe colicky flank right flank pain radiating to her abdomen. She states the pain has been worsening for 3 days, today is severe and she cannot get comfortable. She is unsure if she is having contractions or if the kidney stone has moved.  ? ?She had a UA yesterday in the office which was WNL, no blood present. ? ?In the office today, she had a speculum exam which showed cervix closed and thick.  ? ?Wet prep and GC/CL in the office today negative. ? ?She saw urology 06/17/2021 and they recommended if pain is not severe, continue observation. If pain becomes severe again, she should be evaluated for the necessity of nephrostomy vs stent placement. ? ?S: Resting more comfortably. She states the pain is still present but has improved since she was seen in the office. no CTX, no VB.no LOF,  Active fetal movement.   ?Denies: HA, visual changes, SOB, or RUQ/epigastric pain ? ?Maternal Medical History:  ? ?Past Medical History:  ?Diagnosis Date  ? Kidney stones   ? ? ?Past Surgical History:  ?Procedure Laterality Date  ? KIDNEY STONE SURGERY    ? TONSILLECTOMY    ? Tubes in ears    ? ? ?Allergies  ?Allergen Reactions  ? Peanut-Containing Drug Products Anaphylaxis  ?  Other reaction(s): SWELLING  ? Lidocaine Hcl Swelling  ? Peanut Oil Hives  ? ? ?Prior to Admission medications   ?Medication Sig Start Date End Date Taking? Authorizing Provider  ?Prenatal Vit-Fe Fumarate-FA (MULTIVITAMIN-PRENATAL) 27-0.8 MG TABS tablet Take 1 tablet by mouth daily at 12 noon.   Yes [provider]  ?cyclobenzaprine (FLEXERIL) 5 MG tablet Take 5 mg by mouth 3 (three) times daily as needed. ?Patient not  taking: Reported on 07/04/2021 06/11/21   [provider]  ?tamsulosin (FLOMAX) 0.4 MG CAPS capsule Take 0.4 mg by mouth daily. ?Patient not taking: Reported on 07/04/2021 06/11/21   [provider]  ? ? ?Social History: She  reports that she has never smoked. She has never used smokeless tobacco. She reports current alcohol use. She reports that she does not use drugs. ? ?Family History: family history includes Breast cancer (age of onset: 43) in her cousin; Breast cancer (age of onset: 67) in her paternal aunt; Breast cancer (age of onset: 90) in an other family member; Breast cancer (age of onset: 77) in her paternal grandmother.  no history of gyn cancers ? ?Review of Systems: A full review of systems was performed and negative except as noted in the HPI.   ? ? ?O: ? BP 114/69 (BP Location: Left Arm)   Pulse 91   Temp 98 ?F (36.7 ?C) (Oral)   Resp 18   LMP 01/03/2021  ?No results found for this or any previous visit (from the past 48 hour(s)).  ? ?Constitutional: NAD, AAOx3  ?HE/ENT: extraocular movements grossly intact, moist mucous membranes ?CV: RRR ?PULM: nl respiratory effort, CTABL     ?Abd: gravid, non-tender, non-distended, soft      ?Ext: Non-tender, Nonedematous   ?Psych: mood appropriate, speech normal ?Pelvic: deferred (SSE in the office showed cervix closed and thick) ? ?Fetal  monitoring: Cat 1 Appropriate for GA ?  Baseline: 150bpm ?Variability: moderate ?Accelerations: present x >2 ?Decelerations absent ? ?Kidney US: 07/04/21 ?1. Stable mild right hydronephrosis and nonobstructing right renal ?calculus. ?2. Unremarkable left kidney. ?3. Decompressed bladder limiting evaluation. ? ?A/P: 26 y.o. [redacted]w[redacted]d here for antenatal surveillance for right flank pain, abdominal pain ? ?Principle Diagnosis:  High risk pregnancy in third trimester ? ?Labor: not present. Will continue monitoring with continuous tocometer to ensure no contractions. Abdomen palpates soft.  ?Fetal Wellbeing: Reassuring  Cat 1 tracing. ?Kidney US appears unchanged from 06/13/21 [place]. ?Notified Dr. Richardo Hanks with urology that pt is here and is seeming to have worsening kidney stone symptoms. Per Dr. Richardo Hanks, if pain improves, she can follow-up outpatient as needed. If pain continues or worsens, she may need a low-dose CT scan and his service will evaluate her in the morning and she will be made NPO at midnight.  ?IVPM Morphine 4mg  IV q2hrs PRN pain  ?Continuous IV fluids ?Prolonged monitoring for at least 4-6 hours. If pain resolves, consider discharging for outpatient follow-up. If pain continues or worsens, admit overnight for pain medication, continued monitoring, r/o PTL, and urology evaluation.  ?Dr. aware and agreeable with POC.  ? ?Feliberto Gottron, CNM ?07/04/2021 ?5:59 PM ? ?

## 2021-07-04 NOTE — Progress Notes (Signed)
Pt Kristen Stevens 26 y.o. presents to labor and delivery triage reporting right abdominal pain that wraps to the right lower back . Pt is a G2P1001 at [redacted]w[redacted]d . Pt denies signs and symptons consistent with rupture of membranes or active vaginal bleeding. Pt denies contractions and states positive fetal movement. Pt reports ongoing issues with kidney stones throughout her pregnancy, states, "constant stabbing on right side" rating pain 10/10.  External FM and TOCO applied to non-tender abdomen and assessing. Initial FHR 150 . Vital signs obtained and within normal limits. Provider notified of pt. ? ?

## 2021-07-05 DIAGNOSIS — R109 Unspecified abdominal pain: Secondary | ICD-10-CM | POA: Diagnosis not present

## 2021-07-05 DIAGNOSIS — O26832 Pregnancy related renal disease, second trimester: Secondary | ICD-10-CM | POA: Diagnosis not present

## 2021-07-05 DIAGNOSIS — N1330 Unspecified hydronephrosis: Secondary | ICD-10-CM | POA: Diagnosis not present

## 2021-07-05 NOTE — Discharge Summary (Signed)
Kristen Stevens is a 26 y.o. female. She is at [redacted]w[redacted]d gestation. Patient's last menstrual period was 01/03/2021. ?Estimated Date of Delivery: 10/10/21 ? ?Prenatal care site: [facility]  ? ?Current pregnancy complicated by:  ?Obesity ?Kidney stone ?Rubella non-immune ? ?Chief complaint: kidney stone, right flank pain ? ?S: Resting comfortably. States her flank pain has resolved. Pain is now 2/10. no CTX, no VB.no LOF,  Active fetal movement.  ?Denies: HA, visual changes, SOB, or RUQ/epigastric pain ? ?Maternal Medical History:  ? ?Past Medical History:  ?Diagnosis Date  ? Kidney stones   ? ? ?Past Surgical History:  ?Procedure Laterality Date  ? KIDNEY STONE SURGERY    ? TONSILLECTOMY    ? Tubes in ears    ? ? ?Allergies  ?Allergen Reactions  ? Peanut-Containing Drug Products Anaphylaxis  ?  Other reaction(s): SWELLING  ? Lidocaine Hcl Swelling  ? Peanut Oil Hives  ? ? ?Prior to Admission medications   ?Medication Sig Start Date End Date Taking? Authorizing Provider  ?Prenatal Vit-Fe Fumarate-FA (MULTIVITAMIN-PRENATAL) 27-0.8 MG TABS tablet Take 1 tablet by mouth daily at 12 noon.   Yes [provider]  ?cyclobenzaprine (FLEXERIL) 5 MG tablet Take 5 mg by mouth 3 (three) times daily as needed. ?Patient not taking: Reported on 07/04/2021 06/11/21   [provider]  ?tamsulosin (FLOMAX) 0.4 MG CAPS capsule Take 0.4 mg by mouth daily. ?Patient not taking: Reported on 07/04/2021 06/11/21   [provider]  ? ? ? ? ?Social History: She  reports that she has never smoked. She has never used smokeless tobacco. She reports current alcohol use. She reports that she does not use drugs. ? ?Family History: family history includes Breast cancer (age of onset: 84) in her cousin; Breast cancer (age of onset: 46) in her paternal aunt; Breast cancer (age of onset: 32) in an other family member; Breast cancer (age of onset: 64) in her paternal grandmother.  no history of gyn cancers ? ?Review of  Systems: A full review of systems was performed and negative except as noted in the HPI.   ? ? ?O: ? BP 108/64   Pulse 82   Temp 98.4 ?F (36.9 ?C) (Oral)   Resp 16   LMP 01/03/2021  ?Results for orders placed or performed during the hospital encounter of 07/04/21 (from the past 48 hour(s))  ?CBC  ? Collection Time: 07/04/21 10:15 PM  ?Result Value Ref Range  ? WBC 15.4 (H) 4.0 - 10.5 K/uL  ? RBC 3.99 3.87 - 5.11 MIL/uL  ? Hemoglobin 10.4 (L) 12.0 - 15.0 g/dL  ? HCT 31.7 (L) 36.0 - 46.0 %  ? MCV 79.4 (L) 80.0 - 100.0 fL  ? MCH 26.1 26.0 - 34.0 pg  ? MCHC 32.8 30.0 - 36.0 g/dL  ? RDW 14.6 11.5 - 15.5 %  ? Platelets 216 150 - 400 K/uL  ? nRBC 0.0 0.0 - 0.2 %  ?Comprehensive metabolic panel  ? Collection Time: 07/04/21 10:15 PM  ?Result Value Ref Range  ? Sodium 138 135 - 145 mmol/L  ? Potassium 3.5 3.5 - 5.1 mmol/L  ? Chloride 109 98 - 111 mmol/L  ? CO2 19 (L) 22 - 32 mmol/L  ? Glucose, Bld 141 (H) 70 - 99 mg/dL  ? BUN 6 6 - 20 mg/dL  ? Creatinine, Ser 0.43 (L) 0.44 - 1.00 mg/dL  ? Calcium 9.0 8.9 - 10.3 mg/dL  ? Total Protein 6.3 (L) 6.5 - 8.1 g/dL  ?  Albumin 2.9 (L) 3.5 - 5.0 g/dL  ? AST 18 15 - 41 U/L  ? ALT 13 0 - 44 U/L  ? Alkaline Phosphatase 109 38 - 126 U/L  ? Total Bilirubin 0.6 0.3 - 1.2 mg/dL  ? GFR, Estimated >60 >60 mL/min  ? Anion gap 10 5 - 15  ?  ? ?Constitutional: NAD, AAOx3  ?HE/ENT: extraocular movements grossly intact, moist mucous membranes ?CV: RRR ?PULM: nl respiratory effort, CTABL     ?Abd: gravid, non-tender, non-distended, soft      ?Ext: Non-tender, Nonedematous   ?Psych: mood appropriate, speech normal ?Pelvic: deferred ? ?Fetal  monitoring: Cat 1 Appropriate for GA ?Baseline: 145bpm ?Variability: moderate ?Accelerations: present x >2 ?Decelerations absent ?Time ? ?Kidney US: 07/04/21 ?1. Stable mild right hydronephrosis and nonobstructing right renal ?calculus. ?2. Unremarkable left kidney. ?3. Decompressed bladder limiting evaluation. ? ?A/P: 26 y.o. [redacted]w[redacted]d here for antenatal  surveillance for right flank pain, kidney stone ? ?Principle Diagnosis:  High risk pregnancy in third trimester ? ?Kidney stone pain resolved. Dr. Richardo Hanks with Urology came and talked with her and they decided she is stable for discharge and outpatient follow-up in 2 weeks. See his note for more details. ?Labor: not present.  ?Fetal Wellbeing: Reassuring Cat 1 tracing. ?Reactive NST  ?D/c home stable, precautions reviewed, follow-up as scheduled.  ? ? ?[name], CNM ?07/05/2021 ?7:27 AM ? ?

## 2021-07-05 NOTE — Consult Note (Signed)
?  ? ?Urology Consult  ? ?I have been asked to see the patient by Donato Schultz, CNM for evaluation and management of mild right hydronephrosis. ? ?Chief Complaint: Right-sided flank pain ? ?HPI:  ?Kristen Stevens is a 26 y.o. year old female at [redacted] weeks gestation with prior history of kidney stones as a teenager who was admitted overnight with right-sided flank pain.  She was seen originally by Dr. Lonna Cobb in urology clinic on 06/17/2021 when renal ultrasound showed mild right-sided hydronephrosis, and she opted for trial of medical expulsive therapy.  She did not tolerate the Flomax well.  She had been doing well until developing recurrent severe right-sided flank pain/renal colic and nausea last night and presenting to the L&D triage.  She denies any fevers or chills.  A repeat ultrasound showed mild right-sided hydronephrosis, urinalysis 07/03/2021 was completely benign with no microscopic hematuria.  Renal function is normal. ? ?This morning, her pain is very well controlled, and she denies any fevers, nausea, vomiting, or urinary symptoms. ? ?PMH: ?Past Medical History:  ?Diagnosis Date  ? Kidney stones   ? ? ?Surgical History: ?Past Surgical History:  ?Procedure Laterality Date  ? KIDNEY STONE SURGERY    ? TONSILLECTOMY    ? Tubes in ears    ? ? ?Allergies:  ?Allergies  ?Allergen Reactions  ? Peanut-Containing Drug Products Anaphylaxis  ?  Other reaction(s): SWELLING  ? Lidocaine Hcl Swelling  ? Peanut Oil Hives  ? ? ?Family History: ?Family History  ?Problem Relation Age of Onset  ? Breast cancer Paternal Grandmother 10  ? Breast cancer Paternal Aunt 34  ? Breast cancer Cousin 67  ? Breast cancer Other 48  ? ? ?Social History:  reports that she has never smoked. She has never used smokeless tobacco. She reports current alcohol use. She reports that she does not use drugs. ? ?ROS: ?Negative aside from those stated in the HPI. ? ?Physical Exam: ?BP 108/64   Pulse 82   Temp 98.4 ?F (36.9 ?C) (Oral)   Resp  16   LMP 01/03/2021   ? ?Constitutional:  Alert and oriented, No acute distress. ?Cardiovascular: No clubbing, cyanosis, or edema. ?Respiratory: Normal respiratory effort, no increased work of breathing. ?GI: Abdomen is soft, gravid uterus ?GU: Minimal right CVA tenderness ?Lymph: No cervical or inguinal lymphadenopathy. ?Skin: No rashes, bruises or suspicious lesions. ?Neurologic: Grossly intact, no focal deficits, moving all 4 extremities. ?Psychiatric: Normal mood and affect. ? ?Laboratory Data: ?Reviewed, see HPI ? ?Pertinent Imaging: ?I have personally reviewed the renal ultrasound showing mild right-sided hydronephrosis with no evidence of ureteral stone. ? ?Assessment & Plan:   ?26 year old female with history of nephrolithiasis previously requiring ureteroscopy at [facility] as a teenager who presents with recurrent right-sided flank pain and renal ultrasound showing mild right-sided hydronephrosis.  Unable to identify any ureteral stone on ultrasound.  Urinalysis 2 days ago was completely benign, and mild right hydronephrosis is stable from prior ultrasound on 06/13/2021.  Her pain is well controlled this morning and she feels well.  No clinical or laboratory evidence of sepsis or infection. ? ?We discussed options at length including ongoing observation with pain control, low-dose CT scan for further evaluation of hydronephrosis to determine if there is a ureteral stone versus this is physiologic.  We discussed options if she did have a ureteral stone including ureteroscopy/laser lithotripsy, stent placement, nephrostomy tube placement.  Stent related symptoms during pregnancy discussed.  Risk and benefits discussed at length including bleeding, infection,  ureteral injury, preterm labor, loss of pregnancy. ? ?Her pain is very well controlled this morning and she would like to discharge home.  Return precautions discussed at length including uncontrolled renal colic or fever over  101.3. ? ? ?Recommendations: ?Patient prefers discharge home with ongoing observation, return precautions discussed ?We will coordinate follow-up with Dr. Lonna Cobb in 2 to 3 weeks for symptom check ? ?Sondra Come, MD ? ? ? ?[facility] ?[address] ?[place], [place] [address] ?[phone] ? ?

## 2021-07-05 NOTE — Progress Notes (Signed)
Patient given discharge instructions and patient verbalizes understanding. Patient home ambulatory in good condition by self. All concerns/questions addressed with urology and CNM this am.  ?

## 2021-07-07 ENCOUNTER — Telehealth: Payer: Self-pay | Admitting: Obstetrics and Gynecology

## 2021-07-07 ENCOUNTER — Observation Stay
Admission: EM | Admit: 2021-07-07 | Discharge: 2021-07-08 | Disposition: A | Discharge status: Home / Self Care | Payer: Medicaid Other | Attending: Obstetrics and Gynecology | Admitting: Obstetrics and Gynecology

## 2021-07-07 DIAGNOSIS — R102 Pelvic and perineal pain: Secondary | ICD-10-CM | POA: Insufficient documentation

## 2021-07-07 DIAGNOSIS — O99212 Obesity complicating pregnancy, second trimester: Secondary | ICD-10-CM | POA: Insufficient documentation

## 2021-07-07 DIAGNOSIS — E669 Obesity, unspecified: Secondary | ICD-10-CM | POA: Insufficient documentation

## 2021-07-07 DIAGNOSIS — Z91010 Allergy to peanuts: Secondary | ICD-10-CM | POA: Insufficient documentation

## 2021-07-07 DIAGNOSIS — Z79899 Other long term (current) drug therapy: Secondary | ICD-10-CM | POA: Insufficient documentation

## 2021-07-07 DIAGNOSIS — Z3A26 26 weeks gestation of pregnancy: Secondary | ICD-10-CM | POA: Insufficient documentation

## 2021-07-07 DIAGNOSIS — M545 Low back pain, unspecified: Secondary | ICD-10-CM | POA: Insufficient documentation

## 2021-07-07 DIAGNOSIS — O26892 Other specified pregnancy related conditions, second trimester: Principal | ICD-10-CM | POA: Insufficient documentation

## 2021-07-07 NOTE — OB Triage Note (Signed)
Pt Kristen Stevens 26 y.o. presents to labor and delivery triage reporting pelvic and back pain. Pt is a G2P1001 at [redacted]w[redacted]d . She states she has been having this pain since Tuesday and was seen in the office Wednesday for urinalysis and was told she had no UTI. She states she is straining to urinate and is having decreased urination despite increasing water intake. She states she tried Tylenol and comfort measures at home with no relief. She states she has had several episodes of diarrhea today. She also states she has nausea related to the pain. Pt denies signs and symptoms consistent with rupture of membranes or active vaginal bleeding. Pt denies contractions and states positive fetal movement. External FM and TOCO applied to non-tender abdomen and assessing. Vital signs obtained and within normal limits. Heloise Ochoa, CNM notified of pt arrival.  ?

## 2021-07-07 NOTE — Telephone Encounter (Signed)
Incoming call via answering service about pain in pregnancy. Pt is currently [redacted]w[redacted]d with low back pain, vaginal pain and pelvic pressure. Reports pain has been ongoing for several weeks but has worsened over the last week. Reports pain shooting into her vagina that worsens at night when laying in bed and is excruciating when she tries to sit down. Has been using support belt since 23wks. Reports taking 1 tylenol this morning when pain was really bad, but has not repeated the dose.  ? ?Discussed likely musculoskeletal pain related to pregnancy, round ligament pains. Encouraged to try comfort measures including warm bath, tylenol 1000mg  PO.  ?No preterm contractions, LOF, VB and reports active FM. If pt is concerned, may report to [facility] for L&D triage eval if desired.  ? ?[facility], CNM ?07/07/2021 ?6:48 PM ? ?

## 2021-07-08 ENCOUNTER — Encounter: Payer: Self-pay | Admitting: Obstetrics and Gynecology

## 2021-07-08 ENCOUNTER — Other Ambulatory Visit: Payer: Self-pay

## 2021-07-08 DIAGNOSIS — Z79899 Other long term (current) drug therapy: Secondary | ICD-10-CM | POA: Diagnosis not present

## 2021-07-08 DIAGNOSIS — O26892 Other specified pregnancy related conditions, second trimester: Secondary | ICD-10-CM | POA: Diagnosis present

## 2021-07-08 DIAGNOSIS — R102 Pelvic and perineal pain: Secondary | ICD-10-CM | POA: Diagnosis not present

## 2021-07-08 DIAGNOSIS — Z91010 Allergy to peanuts: Secondary | ICD-10-CM | POA: Diagnosis not present

## 2021-07-08 DIAGNOSIS — Z3A26 26 weeks gestation of pregnancy: Secondary | ICD-10-CM | POA: Diagnosis not present

## 2021-07-08 DIAGNOSIS — M545 Low back pain, unspecified: Secondary | ICD-10-CM | POA: Diagnosis not present

## 2021-07-08 DIAGNOSIS — E669 Obesity, unspecified: Secondary | ICD-10-CM | POA: Diagnosis not present

## 2021-07-08 DIAGNOSIS — O99212 Obesity complicating pregnancy, second trimester: Secondary | ICD-10-CM | POA: Diagnosis not present

## 2021-07-08 LAB — URINALYSIS, COMPLETE (UACMP) WITH MICROSCOPIC
Bilirubin Urine: NEGATIVE
Glucose, UA: NEGATIVE mg/dL
Hgb urine dipstick: NEGATIVE
Ketones, ur: 20 mg/dL — AB
Leukocytes,Ua: NEGATIVE
Nitrite: NEGATIVE
Protein, ur: NEGATIVE mg/dL
Specific Gravity, Urine: 1.017 (ref 1.005–1.030)
pH: 6 (ref 5.0–8.0)

## 2021-07-08 LAB — WET PREP, GENITAL
Sperm: NONE SEEN
Trich, Wet Prep: NONE SEEN
WBC, Wet Prep HPF POC: >=10 — AB (ref <10)
Yeast Wet Prep HPF POC: NONE SEEN

## 2021-07-08 MED ORDER — OXYCODONE-ACETAMINOPHEN 5-325 MG PO TABS
2.0000 | ORAL_TABLET | Freq: Once | ORAL | Status: AC
  Administered 2021-07-08: 2 via ORAL

## 2021-07-08 MED ORDER — CYCLOBENZAPRINE HCL 10 MG PO TABS
10.0000 mg | ORAL_TABLET | Freq: Three times a day (TID) | ORAL | Status: DC | PRN
  Administered 2021-07-08: 10 mg via ORAL
  Filled 2021-07-08 (×2): qty 1

## 2021-07-08 MED ORDER — TAMSULOSIN HCL 0.4 MG PO CAPS
0.4000 mg | ORAL_CAPSULE | Freq: Once | ORAL | Status: DC
  Filled 2021-07-08: qty 1

## 2021-07-08 MED ORDER — OXYCODONE-ACETAMINOPHEN 5-325 MG PO TABS
ORAL_TABLET | ORAL | Status: AC
  Filled 2021-07-08: qty 2

## 2021-07-08 MED ORDER — CYCLOBENZAPRINE HCL 5 MG PO TABS
5.0000 mg | ORAL_TABLET | Freq: Three times a day (TID) | ORAL | 0 refills | Status: DC | PRN
Start: 2021-07-08 — End: 2021-07-11

## 2021-07-08 MED ORDER — METRONIDAZOLE 500 MG PO TABS
500.0000 mg | ORAL_TABLET | Freq: Two times a day (BID) | ORAL | Status: DC
  Administered 2021-07-08: 500 mg via ORAL
  Filled 2021-07-08 (×2): qty 1

## 2021-07-08 MED ORDER — METRONIDAZOLE 500 MG PO TABS
500.0000 mg | ORAL_TABLET | Freq: Two times a day (BID) | ORAL | 0 refills | Status: AC
Start: 2021-07-08 — End: 2021-07-15

## 2021-07-08 NOTE — OB Triage Note (Signed)
While attempting to provide pt with discharge instructions and offer ordered pain medications, pt stated she was concerned with taking the PO pain medication and preferred IV pain medication. Contacted McVey CNM to discuss concerns with pt at bedside. McVey CNM offered reassurance about the plan of care and the safety of the prescribed medication. Patient states she is dissatisfied with plan of care to be discharged. This RN escalated the pt's concerns to the charge nurse who then consulted the shift coordinator and Neurosurgeon. Administrative coordinator came to bedside to discuss plan with the pt. Pt is now agreeable to the plan of care. Pt will remain on unit until safe to drive home.  ?

## 2021-07-08 NOTE — Discharge Summary (Addendum)
Kristen Stevens is a 26 y.o. female. She is at 103w4d gestation. Patient's last menstrual period was 01/03/2021. ?Estimated Date of Delivery: 10/10/21 ? ?Prenatal care site: [facility]  ? ?Current pregnancy complicated by:  ?Obesity ?Kidney stone ?Rubella non-immune ? ?Chief complaint: low back pain, vagina pain, flank pain ?- tried a dose of tylenol without relief.  ?- feels like urinating only small amounts at a time and it burns.  ?- requesting IV pain medication ? ?S: Resting comfortably but notably anxious, crying. no CTX, no VB.no LOF,  Active fetal movement. Denies: HA, visual changes, SOB, or RUQ/epigastric pain ? ?Maternal Medical History:  ? ?Past Medical History:  ?Diagnosis Date  ? Kidney stones   ? ? ?Past Surgical History:  ?Procedure Laterality Date  ? KIDNEY STONE SURGERY    ? TONSILLECTOMY    ? Tubes in ears    ? ? ?Allergies  ?Allergen Reactions  ? Peanut-Containing Drug Products Anaphylaxis  ?  Other reaction(s): SWELLING  ? Lidocaine Hcl Swelling  ? Peanut Oil Hives  ? ? ?Prior to Admission medications   ?Medication Sig Start Date End Date Taking? Authorizing Provider  ?Prenatal Vit-Fe Fumarate-FA (MULTIVITAMIN-PRENATAL) 27-0.8 MG TABS tablet Take 1 tablet by mouth daily at 12 noon.   Yes [provider]  ?cyclobenzaprine (FLEXERIL) 5 MG tablet Take 5 mg by mouth 3 (three) times daily as needed. ?Patient not taking: Reported on 07/04/2021 06/11/21   [provider]  ?tamsulosin (FLOMAX) 0.4 MG CAPS capsule Take 0.4 mg by mouth daily. ?Patient not taking: Reported on 07/04/2021 06/11/21   [provider]  ? ? ? ? ?Social History: She  reports that she has never smoked. She has never used smokeless tobacco. She reports that she does not currently use alcohol. She reports that she does not use drugs. ? ?Family History: family history includes Breast cancer (age of onset: 94) in her cousin; Breast cancer (age of onset: 3) in her paternal aunt; Breast cancer (age of  onset: 36) in an other family member; Breast cancer (age of onset: 59) in her paternal grandmother; Kidney cancer in her paternal grandfather.  no history of gyn cancers ? ?Review of Systems: A full review of systems was performed and negative except as noted in the HPI.   ? ? ?O: ? BP 110/65   Pulse (!) 106   Temp 98.5 ?F (36.9 ?C) (Oral)   Resp 20   Ht 5\' 2"  (1.575 m)   Wt 88.9 kg   LMP 01/03/2021   BMI 35.85 kg/m?  ?Results for orders placed or performed during the hospital encounter of 07/07/21 (from the past 48 hour(s))  ?Urinalysis, Complete w Microscopic Vaginal  ? Collection Time: 07/08/21 12:28 AM  ?Result Value Ref Range  ? Color, Urine YELLOW (A) YELLOW  ? APPearance HAZY (A) CLEAR  ? Specific Gravity, Urine 1.017 1.005 - 1.030  ? pH 6.0 5.0 - 8.0  ? Glucose, UA NEGATIVE NEGATIVE mg/dL  ? Hgb urine dipstick NEGATIVE NEGATIVE  ? Bilirubin Urine NEGATIVE NEGATIVE  ? Ketones, ur 20 (A) NEGATIVE mg/dL  ? Protein, ur NEGATIVE NEGATIVE mg/dL  ? Nitrite NEGATIVE NEGATIVE  ? Leukocytes,Ua NEGATIVE NEGATIVE  ? RBC / HPF 6-10 0 - 5 RBC/hpf  ? WBC, UA 21-50 0 - 5 WBC/hpf  ? Bacteria, UA FEW (A) NONE SEEN  ? Squamous Epithelial / LPF 21-50 0 - 5  ? Mucus PRESENT   ? Triple Phosphate Crystal PRESENT   ?Wet  prep, genital  ? Collection Time: 07/08/21 12:33 AM  ? Specimen: Vaginal  ?Result Value Ref Range  ? Yeast Wet Prep HPF POC NONE SEEN NONE SEEN  ? Trich, Wet Prep NONE SEEN NONE SEEN  ? Clue Cells Wet Prep HPF POC PRESENT (A) NONE SEEN  ? WBC, Wet Prep HPF POC >=10 (A) <10  ? Sperm NONE SEEN   ?  ? ?Abd soft, gravid, NT ?mild right CVAT ?Fundus soft.  ? ?Bladder scan performed- per nursing ? ?Fetal  monitoring: Cat 1 Appropriate for GA ?Baseline: 140bpm ?Variability: moderate ?Accelerations: present x >2 ?Decelerations absent ?Time ? ?Kidney US: 07/04/21 ?1. Stable mild right hydronephrosis and nonobstructing right renal ?calculus. ?2. Unremarkable left kidney. ?3. Decompressed bladder limiting  evaluation. ? ?A/P: 26 y.o. [redacted]w[redacted]d here for antenatal surveillance for low back pain, vaginal pain and right flank pain.  ? ?Principle Diagnosis:  High risk pregnancy in third trimester ? ?Pain c/w ligament pain, given flexeril with somerelief, encouraged stretching and support belt.  ?Given percocet 2 tabs, flomax 1 dose. Stable right kidney stone and hydronephrosis noted on [place] 3d ago. Encouraged to f/u with Urology on Monday.  ?Pt anxious about taking PO narcotics and requesting IV narcotic pain medication, discussed R/B and length of relief; long term effects of narcotic use on pregnancy if chronic use.  ?Sent urine culture from cath specimen ?UA unremarkable- vaginal contamination likely; WBC=squamous cells, neg nitrites.  ?Wet prep c/w BV, initial dose of flagyl given in triage. ?Preterm labor: not present.  ?Fetal Wellbeing: Reassuring Cat 1 tracing Reactive NST  ?D/c home stable, precautions reviewed, follow-up as scheduled.  ? ? ?Randa Ngo, CNM ?07/08/2021 ?2:20 AM ? ?

## 2021-07-08 NOTE — Progress Notes (Signed)
Discussed patient's understanding of her plan of care and pain upon morning assessment. Pt states that she is planning on calling this morning to make an appointment with urology. Pt states that her pain is better but her pressure is still unchanged. However, pt states that she is aware of her disease process and is comfortable with plan to discharge this morning.  ?VSS. Pt able to talk comfortably with RN at this time and reports feeling ok to drive home.  ? ?Discussed POC with CNM- CNM approved pt discharge. ?Pt discharged home per order.  Pt stable and ambulatory and an After Visit Summary was printed and given to the patient. Discharge education completed with patient/family including follow up instructions, appointments, and medication list. Pt received labor and bleeding precautions. Patient able to verbalize understanding, all questions fully answered upon discharge. Patient instructed to return to ED, call 911, or call MD for any changes in condition.    ?

## 2021-07-08 NOTE — OB Triage Note (Deleted)
Pt discharged home per order.   Pt stable and ambulatory and an After Visit Summary was printed and given to the patient. Discharge education completed with patient including follow up instructions, appointments, and medication list. Pt received labor and bleeding precautions. Patient able to verbalize understanding, all questions fully answered upon discharge. Patient instructed to return to ED, call 911, or call MD for any changes in condition. Pt discharged home via personal vehicle. 

## 2021-07-09 ENCOUNTER — Telehealth: Payer: Self-pay | Admitting: *Deleted

## 2021-07-09 LAB — URINE CULTURE: Culture: NO GROWTH

## 2021-07-09 NOTE — Telephone Encounter (Addendum)
Patient states she was at work today and started having right lower back back and than started vomiting .She started having chills when the pain started. Patient left work and now her pain level is a 7 . She has not vomited since leaving work. She called her OBGYN and they told her to call us.  ? ?Patient is [redacted] weeks pregnant.   ? ?/Made patient a office visit to follow up with sam on 07/11/2021 @ 900am  ?

## 2021-07-11 ENCOUNTER — Ambulatory Visit (INDEPENDENT_AMBULATORY_CARE_PROVIDER_SITE_OTHER): Payer: Medicaid Other | Admitting: Physician Assistant

## 2021-07-11 ENCOUNTER — Encounter: Payer: Self-pay | Admitting: Physician Assistant

## 2021-07-11 VITALS — BP 120/80 | HR 93 | Ht 62.0 in | Wt 196.0 lb

## 2021-07-11 DIAGNOSIS — Z87442 Personal history of urinary calculi: Secondary | ICD-10-CM

## 2021-07-11 DIAGNOSIS — R109 Unspecified abdominal pain: Secondary | ICD-10-CM | POA: Diagnosis not present

## 2021-07-11 LAB — MICROSCOPIC EXAMINATION

## 2021-07-11 LAB — URINALYSIS, COMPLETE
Bilirubin, UA: NEGATIVE
Glucose, UA: NEGATIVE
Leukocytes,UA: NEGATIVE
Nitrite, UA: NEGATIVE
RBC, UA: NEGATIVE
Specific Gravity, UA: 1.02 (ref 1.005–1.030)
Urobilinogen, Ur: 2 mg/dL — ABNORMAL HIGH (ref 0.2–1.0)
pH, UA: 7 (ref 5.0–7.5)

## 2021-07-11 NOTE — Progress Notes (Signed)
? ?07/11/2021 ?9:04 AM  ? ?Kristen Stevens ?Jan 25, 1996 ?[id number] ? ?CC: ?Chief Complaint  ?Patient presents with  ? Nephrolithiasis  ? ?HPI: ?Kristen Stevens is a 26 y.o. pregnant female at [redacted]w[redacted]d with PMH nephrolithiasis requiring URS in her teens who presents today for evaluation of possible acute stone episode.  ? ?She has been seen by Dr. Lonna Cobb in clinic and Dr. Richardo Hanks in L&D triage for intermittent right flank pain.  She has had stable mild right hydronephrosis on renal ultrasounds and benign UA is during this time.  We have deferred further imaging due to adequate pain control. ? ?Today she reports that her right flank pain returned and was rather severe and associated with vomiting 2 days ago.  It has since resolved.  She states that when her pain occurs, it typically lasts 5 to 6 hours before receding.  She has no pain today. ? ?In-office UA today positive for 2+ ketones, 1+ blood, and 2.0 EU/DL urobilinogen; urine microscopy with granular casts and moderate bacteria.   ? ?PMH: ?Past Medical History:  ?Diagnosis Date  ? Kidney stones   ? ? ?Surgical History: ?Past Surgical History:  ?Procedure Laterality Date  ? KIDNEY STONE SURGERY    ? TONSILLECTOMY    ? Tubes in ears    ? ? ?Home Medications:  ?Allergies as of 07/11/2021   ? ?   Reactions  ? Peanut-containing Drug Products Anaphylaxis  ? Other reaction(s): SWELLING  ? Lidocaine Hcl Swelling  ? Peanut Oil Hives  ? ?  ? ?  ?Medication List  ?  ? ?  ? Accurate as of July 11, 2021  9:04 AM. If you have any questions, ask your nurse or doctor.  ?  ?  ? ?  ? ?cyclobenzaprine 5 MG tablet ?Commonly known as: FLEXERIL ?Take 1 tablet (5 mg total) by mouth 3 (three) times daily as needed for up to 5 days for muscle spasms. ?  ?metroNIDAZOLE 500 MG tablet ?Commonly known as: FLAGYL ?Take 1 tablet (500 mg total) by mouth 2 (two) times daily for 7 days. ?  ?multivitamin-prenatal 27-0.8 MG Tabs tablet ?Take 1 tablet by mouth daily at 12 noon. ?  ? ?   ? ? ?Allergies:  ?Allergies  ?Allergen Reactions  ? Peanut-Containing Drug Products Anaphylaxis  ?  Other reaction(s): SWELLING  ? Lidocaine Hcl Swelling  ? Peanut Oil Hives  ? ? ?Family History: ?Family History  ?Problem Relation Age of Onset  ? Breast cancer Paternal Aunt 42  ? Breast cancer Paternal Grandmother 74  ? Kidney cancer Paternal Grandfather   ? Breast cancer Cousin 27  ? Breast cancer Other 48  ? ? ?Social History:  ? reports that she has never smoked. She has never used smokeless tobacco. She reports that she does not currently use alcohol. She reports that she does not use drugs. ? ?Physical Exam: ?LMP 01/03/2021   ?Constitutional:  Alert and oriented, no acute distress, nontoxic appearing ?HEENT: [place], AT ?Cardiovascular: No clubbing, cyanosis, or edema ?Respiratory: Normal respiratory effort, no increased work of breathing ?Skin: No rashes, bruises or suspicious lesions ?Neurologic: Grossly intact, no focal deficits, moving all 4 extremities ?Psychiatric: Normal mood and affect ? ?Laboratory Data: ?Results for orders placed or performed in visit on 07/11/21  ?Microscopic Examination  ? Urine  ?Result Value Ref Range  ? WBC, UA 0-5 0 - 5 /hpf  ? RBC 0-2 0 - 2 /hpf  ? Epithelial Cells (non renal) 0-10 0 -  10 /hpf  ? Casts Present (A) None seen /lpf  ? Cast Type Granular casts (A) N/A  ? Mucus, UA Present (A) Not Estab.  ? Bacteria, UA Moderate (A) None seen/Few  ?Urinalysis, Complete  ?Result Value Ref Range  ? Specific Gravity, UA 1.020 1.005 - 1.030  ? pH, UA 7.0 5.0 - 7.5  ? Color, UA Orange Yellow  ? Appearance Ur Clear Clear  ? Leukocytes,UA Negative Negative  ? Protein,UA 1+ (A) Negative/Trace  ? Glucose, UA Negative Negative  ? Ketones, UA 2+ (A) Negative  ? RBC, UA Negative Negative  ? Bilirubin, UA Negative Negative  ? Urobilinogen, Ur 2.0 (H) 0.2 - 1.0 mg/dL  ? Nitrite, UA Negative Negative  ? Microscopic Examination See below:   ? ?Assessment & Plan:   ?1. Flank pain with history of  urolithiasis ?Asymptomatic today.  UA with no pyuria or hematuria.  VSS.  Will send for culture and treat as indicated. ? ?We discussed that the next step would be to proceed with low-dose CT AP without contrast to definitively evaluate her for possible right ureteral stone.  We discussed that undergoing the CT scan would cause a less than 1% increased risk of childhood cancer for her fetus and overall is considered a rather low risk test. If the stone is identified, treatment options would include right ureteroscopy with laser lithotripsy and stent placement versus stent placement alone versus right PCN. ? ?As patient is asymptomatic today, she would like to defer cross-sectional imaging.  I think this is reasonable.  I counseled her to contact our office if and when her pain recurs and we will proceed with a CT scan as above. ?- Urinalysis, Complete ?- CULTURE, URINE COMPREHENSIVE  ? ?Return if symptoms worsen or fail to improve. ? ?Carman Ching, PA-C ? ?[facility] ?[address], [address] ?[place], [place] [address] ?[phone] ?   ?

## 2021-07-15 LAB — CULTURE, URINE COMPREHENSIVE

## 2021-07-16 ENCOUNTER — Other Ambulatory Visit: Payer: Self-pay | Admitting: *Deleted

## 2021-07-16 MED ORDER — NITROFURANTOIN MONOHYD MACRO 100 MG PO CAPS: 100.0000 mg | ORAL_CAPSULE | Freq: Two times a day (BID) | ORAL | 0 refills | Status: AC

## 2021-08-08 ENCOUNTER — Ambulatory Visit (LOCAL_COMMUNITY_HEALTH_CENTER): Payer: Medicaid Other

## 2021-08-08 DIAGNOSIS — Z23 Encounter for immunization: Secondary | ICD-10-CM

## 2021-08-08 DIAGNOSIS — Z719 Counseling, unspecified: Secondary | ICD-10-CM

## 2021-08-08 NOTE — Progress Notes (Signed)
Pt is [redacted] weeks ga came in for tdap. Administered tdap to left deltoid, tolerated well. Given an updated NCIR. M.Damaris Abeln, LPN.

## 2021-08-23 ENCOUNTER — Inpatient Hospital Stay
Admission: RE | Admit: 2021-08-23 | Discharge: 2021-08-23 | Disposition: A | Discharge status: Home / Self Care | Payer: BC Managed Care – PPO | Source: Ambulatory Visit

## 2021-08-23 HISTORY — DX: Anemia complicating pregnancy, third trimester: O99.013

## 2021-08-23 NOTE — Progress Notes (Addendum)
Perioperative Services  OB/GYN PRE-ANESTHESIA CONSULTATION / REVIEW  Date: 08/23/21  Patient Demographics:  Name: Kristen Stevens DOB:   1995/06/01 MRN:   [id number]  NOTE: Available PAT nursing documentation and vital signs have been reviewed. Available clinical information reviewed in efforts to assist in medical decision making as it pertains to the aforementioned procedure and anticipated anesthetic course.   Pertinent OB/GYN Information:  Birth plan as of 08/23/21: patient plans to deliver via SVD with epidural placement for intrapartum analgesia  OB History     Gravida  2   Para  1   Term  1   Preterm      AB      Living  1      SAB      IAB      Ectopic      Multiple  0   Live Births  1      Clinical Discussion:  Kristen Stevens is a 26 y.o. female referred for pre-anesthesia evaluation prior to planned SVD with [facility] providers. Patient is a 53w1dG2P1A0 with an EDC of 10/10/2021. Due to a documented history previous allergy to lidocaine, patient was referred to anesthesia for pre-delivery discussions. In efforts to prevent patient from having to come in for consultation, seeing whereas there are no BMI or airway concerns, information from anesthesia communicated with patient via telephone. Supporting clinical information gathered from patient's EMR.   Patient has never been a smoker. She denies the use/abuse of any type of substances, including ETOH during this pregnancy. Pertinent PMH includes anemia and nephrolithiasis.   Patient was last seen in the OB/GYN clinic on 08/14/2021; notes reviewed. Intrauterine pregnancy (IUP) has been confirmed by her OB/GYN team via appropriate ultrasound imaging. Uterine size date discrepancy noted. Report [facility] for next routine prenatal visit. No previous complications associated with her pregnancies and/or labor. Patient has received appropriate prenatal care throughout her current pregnancy and everything  appears to be on track for her to deliver via original birthing plan.   No known issues with her cervical or lumbar spine.  She has not had any type of surgical procedures, whether with or without hardware placement, on her neck or lower back.   No documented previous perioperative complications with anesthesia in the past.   No previous difficulties with endotracheal intubation. It has never been documented by medical provider that she had a challenging airway due to her anatomy.   She has undergone previous uncomplicated epidurals in the past; last 05/07/2015. Patient received lidocaine + bupivacaine without noted complications. Pre-anesthesia assessment notes indicate "mom says that pt not allergic to lidocaine. Dentist injected with novocaine and that caused side of her face to swell. No other problems, such as, hypotension fainting etc. OK to use lidocaine for numbing the skin".  Patient has never been diagnosed with OSAH syndrome requiring the use of prescribed nocturnal PAP therapy.   In review the patient's available EMR, it is noted [facility]   Providers/Specialists:   NOTE: Primary physician provider listed below. Patient may have been seen by APP or partner within same practice.   PROVIDER ROLE / SPECIALTY LAST ODaphene Calamity MD OB/GYN  08/14/2021  MFrancetta Found CNM Primary Care Provider 05/16/2021   Allergies:  JLenon Ahmadi(malabar nut tree) [justicia adhatoda], Peanut-containing drug products, Lidocaine hcl, and Peanut oil  Current Home  Medications:    Prenatal Vit-Fe Fumarate-FA (MULTIVITAMIN-PRENATAL) 27-0.8 MG TABS tablet   History:   Past Medical History:  Diagnosis Date   Anemia affecting pregnancy in third trimester    Kidney stones    Past Surgical History:  Procedure Laterality Date   CYSTOSCOPY WITH  URETEROSCOPY AND STENT PLACEMENT Left 10/20/2012   Procedure: CYSTOURETHROSCOPY, WITH URETEROSCOPY AND/OR PYELOSCOPY; WITH LITHOTRIPSY INCLUDING INSERTION OF INDWELLING URETERAL STENT; Location: [facility]; Surgeon: Earney Navy, MD   CYSTOSCOPY WITH URETEROSCOPY AND STENT PLACEMENT Left 11/03/2012   Procedure: CYSTOURETHROSCOPY, WITH URETEROSCOPY AND/OR PYELOSCOPY; WITH LITHOTRIPSY INCLUDING INSERTION OF INDWELLING URETERAL STENT; Location: [facility]; Surgeon: Earney Navy, MD   TONSILLECTOMY     TYMPANOSTOMY TUBE PLACEMENT     Family History  Problem Relation Age of Onset   Breast cancer Paternal Aunt 63   Breast cancer Paternal Grandmother 46   Kidney cancer Paternal Grandfather    Breast cancer Cousin 26   Breast cancer Other 76   Social History   Tobacco Use   Smoking status: Never   Smokeless tobacco: Never  Vaping Use   Vaping Use: Never used  Substance Use Topics   Alcohol use: Not Currently   Drug use: No    Pertinent Clinical Results:   Lab Results  Component Value Date   WBC 15.4 (H) 07/04/2021   HGB 10.4 (L) 07/04/2021   HCT 31.7 (L) 07/04/2021   MCV 79.4 (L) 07/04/2021   PLT 216 07/04/2021   Lab Results  Component Value Date   NA 138 07/04/2021   K 3.5 07/04/2021   CO2 19 (L) 07/04/2021   GLUCOSE 141 (H) 07/04/2021   BUN 6 07/04/2021   CREATININE 0.43 (L) 07/04/2021   CALCIUM 9.0 07/04/2021   GFRNONAA >60 07/04/2021    ECG: No previous ECG on file for provider review at the time of patient consult.    IMAGING / PROCEDURES: OBSTETRICAL ULTRASOUND performed on 07/17/2021 -Unable to view results of this study in [facility].   US RENAL performed on 07/04/2021 Stable mild right hydronephrosis and non obstructing right renal calculus. Unremarkable left kidney. Decompressed bladder limiting evaluation.  Impression and Plan:  Kristen Stevens has been referred to anesthesia for discussions regarding previous documented allergy to lidocaine. Patient  desires epidural support for upcoming SVD.   Extensive record review conducted. It appears as if patient had an epidural here in 04/2015, at which time amide local anesthetics were utilized. Patient received both lidocaine and bupivacaine without documented reaction. Prior to patient receiving these medications, patient met me anesthesia provider who documented a conversation with patient's mother indicated that she was indeed NOT allergic to lidocaine. Patient received novocaine for a dental procedure, which in turn caused localized swelling to the patient's jaw. There have been no further documented incidences of adverse effects of lidocaine in this patient.   With all of that being said, there are other options available for local anesthesia in the setting of a patient with a true amide allergy. Generally, an "allergy" to lidocaine and bupivacaine are associated with reactions to their preservatives. If there is continued concern, could consider an ester agent such as chloroprocaine. In the past, this medication has been associated with such complications as cauda equina syndrome and arachnoiditis if there is inadvertent intrathecal administration. There are now preservative free formulations of this drug has been deemed safe for use for neuraxial anesthesia. Spoke with attending anesthesiologist Andree Elk, MD). Anesthesia and pharmacy here at [facility] have  established a formal protocol for preservative free chloroprocaine infusions should this drug be required. Based on the gathered information, I do not think that this medication would be warranted now that we know that she has tolerated the amides that are typically used. However, with that being said, this decision will come from attending anesthesiologist when patient evaluated for epidural placement. Patient has been advised to have OB floor staff call anesthesia as soon as she presents to the floor so that they can  evaluate her case and make the best decision possible regarding her care.   The anesthesia team is appreciative of her OB/GYN team for involving our service early in this patient's care. Early consultation allows for pre-delivery planning and risk stratification. This ultimately promotes and ensures that our mutual patient will receive the safest and most effective care possible. So again, we appreciate the consult. Please reach out with any questions or concerns that arise during [facility]'s perinatal course. We look forward to working with everyone through this patient's delivery.   Honor Loh, MSN, APRN, FNP-C, CEN [facility]  Peri-operative Services Nurse Practitioner Phone: [phone] Fax: [phone] 08/23/21 1:45 PM  NOTE: This note has been prepared using Dragon dictation software. Despite my best ability to proofread, there is always the potential that unintentional transcriptional errors may still occur from this process.

## 2021-08-26 ENCOUNTER — Inpatient Hospital Stay: Admission: RE | Admit: 2021-08-26 | Payer: BC Managed Care – PPO | Source: Ambulatory Visit

## 2021-08-26 ENCOUNTER — Other Ambulatory Visit: Payer: BC Managed Care – PPO

## 2021-09-19 LAB — OB RESULTS CONSOLE GBS: GBS: NEGATIVE

## 2021-09-20 ENCOUNTER — Other Ambulatory Visit: Payer: Self-pay | Admitting: Certified Nurse Midwife

## 2021-09-20 DIAGNOSIS — O403XX0 Polyhydramnios, third trimester, not applicable or unspecified: Secondary | ICD-10-CM

## 2021-09-20 DIAGNOSIS — Z3490 Encounter for supervision of normal pregnancy, unspecified, unspecified trimester: Secondary | ICD-10-CM

## 2021-09-20 DIAGNOSIS — Z3483 Encounter for supervision of other normal pregnancy, third trimester: Secondary | ICD-10-CM

## 2021-09-20 NOTE — Progress Notes (Signed)
G2P1001 at [redacted]w[redacted]d, LMP of 01/03/2021, c/w early [place] at [redacted]w[redacted]d.  Scheduled for induction of labor for polyhydramnios on 10/03/2021 @ 0800.   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

## 2021-10-03 ENCOUNTER — Encounter: Payer: Self-pay | Admitting: Obstetrics and Gynecology

## 2021-10-03 ENCOUNTER — Inpatient Hospital Stay
Admission: EM | Admit: 2021-10-03 | Discharge: 2021-10-06 | DRG: 806 | Disposition: A | Discharge status: Home / Self Care | Payer: Medicaid Other

## 2021-10-03 ENCOUNTER — Other Ambulatory Visit: Payer: Self-pay

## 2021-10-03 DIAGNOSIS — O26833 Pregnancy related renal disease, third trimester: Secondary | ICD-10-CM | POA: Diagnosis present

## 2021-10-03 DIAGNOSIS — D62 Acute posthemorrhagic anemia: Secondary | ICD-10-CM | POA: Diagnosis not present

## 2021-10-03 DIAGNOSIS — N200 Calculus of kidney: Secondary | ICD-10-CM | POA: Diagnosis present

## 2021-10-03 DIAGNOSIS — Z3A39 39 weeks gestation of pregnancy: Secondary | ICD-10-CM

## 2021-10-03 DIAGNOSIS — Z3490 Encounter for supervision of normal pregnancy, unspecified, unspecified trimester: Secondary | ICD-10-CM | POA: Diagnosis present

## 2021-10-03 DIAGNOSIS — Z3483 Encounter for supervision of other normal pregnancy, third trimester: Secondary | ICD-10-CM

## 2021-10-03 DIAGNOSIS — O403XX0 Polyhydramnios, third trimester, not applicable or unspecified: Secondary | ICD-10-CM | POA: Diagnosis present

## 2021-10-03 DIAGNOSIS — Z884 Allergy status to anesthetic agent status: Secondary | ICD-10-CM | POA: Diagnosis not present

## 2021-10-03 DIAGNOSIS — O99214 Obesity complicating childbirth: Secondary | ICD-10-CM | POA: Diagnosis present

## 2021-10-03 DIAGNOSIS — Z91010 Allergy to peanuts: Secondary | ICD-10-CM | POA: Diagnosis not present

## 2021-10-03 DIAGNOSIS — O320XX0 Maternal care for unstable lie, not applicable or unspecified: Secondary | ICD-10-CM | POA: Diagnosis present

## 2021-10-03 DIAGNOSIS — O9081 Anemia of the puerperium: Secondary | ICD-10-CM | POA: Diagnosis not present

## 2021-10-03 LAB — CBC
HCT: 31 % — ABNORMAL LOW (ref 36.0–46.0)
Hemoglobin: 9.8 g/dL — ABNORMAL LOW (ref 12.0–15.0)
MCH: 23.9 pg — ABNORMAL LOW (ref 26.0–34.0)
MCHC: 31.6 g/dL (ref 30.0–36.0)
MCV: 75.6 fL — ABNORMAL LOW (ref 80.0–100.0)
Platelets: 236 10*3/uL (ref 150–400)
RBC: 4.1 MIL/uL (ref 3.87–5.11)
RDW: 15.9 % — ABNORMAL HIGH (ref 11.5–15.5)
WBC: 14.3 10*3/uL — ABNORMAL HIGH (ref 4.0–10.5)
nRBC: 0 % (ref 0.0–0.2)

## 2021-10-03 LAB — TYPE AND SCREEN
ABO/RH(D): O POS
Antibody Screen: NEGATIVE

## 2021-10-03 MED ORDER — LIDOCAINE HCL (PF) 1 % IJ SOLN
INTRAMUSCULAR | Status: AC
  Filled 2021-10-03: qty 30

## 2021-10-03 MED ORDER — AMMONIA AROMATIC IN INHA
RESPIRATORY_TRACT | Status: AC
  Filled 2021-10-03: qty 10

## 2021-10-03 MED ORDER — OXYTOCIN-SODIUM CHLORIDE 30-0.9 UT/500ML-% IV SOLN: 2.5000 [IU]/h | INTRAVENOUS | Status: DC

## 2021-10-03 MED ORDER — LACTATED RINGERS IV SOLN: INTRAVENOUS | Status: DC

## 2021-10-03 MED ORDER — OXYTOCIN BOLUS FROM INFUSION
333.0000 mL | Freq: Once | INTRAVENOUS | Status: AC
  Administered 2021-10-04: 333 mL via INTRAVENOUS

## 2021-10-03 MED ORDER — MISOPROSTOL 200 MCG PO TABS
ORAL_TABLET | ORAL | Status: AC
  Filled 2021-10-03: qty 4

## 2021-10-03 MED ORDER — ONDANSETRON HCL 4 MG/2ML IJ SOLN: 4.0000 mg | Freq: Four times a day (QID) | INTRAMUSCULAR | Status: DC | PRN

## 2021-10-03 MED ORDER — SOD CITRATE-CITRIC ACID 500-334 MG/5ML PO SOLN: 30.0000 mL | ORAL | Status: DC | PRN

## 2021-10-03 MED ORDER — TERBUTALINE SULFATE 1 MG/ML IJ SOLN: 0.2500 mg | Freq: Once | INTRAMUSCULAR | Status: DC | PRN

## 2021-10-03 MED ORDER — OXYTOCIN 10 UNIT/ML IJ SOLN
INTRAMUSCULAR | Status: AC
  Filled 2021-10-03: qty 2

## 2021-10-03 MED ORDER — LACTATED RINGERS IV SOLN: 500.0000 mL | INTRAVENOUS | Status: DC | PRN

## 2021-10-03 MED ORDER — FENTANYL CITRATE (PF) 100 MCG/2ML IJ SOLN: 50.0000 ug | INTRAMUSCULAR | Status: DC | PRN

## 2021-10-03 MED ORDER — ACETAMINOPHEN 325 MG PO TABS: 650.0000 mg | ORAL_TABLET | ORAL | Status: DC | PRN

## 2021-10-03 MED ORDER — MISOPROSTOL 25 MCG QUARTER TABLET
25.0000 ug | ORAL_TABLET | ORAL | Status: DC | PRN
  Administered 2021-10-03 (×2): 25 ug via VAGINAL
  Filled 2021-10-03 (×3): qty 1

## 2021-10-03 MED ORDER — MISOPROSTOL 25 MCG QUARTER TABLET
25.0000 ug | ORAL_TABLET | ORAL | Status: DC
  Administered 2021-10-03 (×2): 25 ug via ORAL
  Filled 2021-10-03: qty 1

## 2021-10-03 MED ORDER — OXYTOCIN-SODIUM CHLORIDE 30-0.9 UT/500ML-% IV SOLN
1.0000 m[IU]/min | INTRAVENOUS | Status: DC
  Administered 2021-10-03 – 2021-10-04 (×2): 2 m[IU]/min via INTRAVENOUS
  Filled 2021-10-03 (×2): qty 500

## 2021-10-03 NOTE — H&P (Signed)
OB History & Physical   History of Present Illness:  Chief Complaint:   HPI:  Kristen Stevens is a 26 y.o. G2P1001 female at [redacted]w[redacted]d dated by LMP.  She presents to L&D for IOL for polyhydramnios  Active FM No contractions.   Pregnancy Issues: Allergic to peanuts (carries epipen) Allergic to lidocaine Kidney stones in pregnancy Polyhydramnios  Obesity Rubella Non-immune Anemia Unstable fetal lie (transverse, head right @ 38wks)   Maternal Medical History:   Past Medical History:  Diagnosis Date   Anemia affecting pregnancy in third trimester    Kidney stones     Past Surgical History:  Procedure Laterality Date   CYSTOSCOPY WITH URETEROSCOPY AND STENT PLACEMENT Left 10/20/2012   Procedure: CYSTOURETHROSCOPY, WITH URETEROSCOPY AND/OR PYELOSCOPY; WITH LITHOTRIPSY INCLUDING INSERTION OF INDWELLING URETERAL STENT; Location: [facility]; Surgeon: Macy Mis, MD   CYSTOSCOPY WITH URETEROSCOPY AND STENT PLACEMENT Left 11/03/2012   Procedure: CYSTOURETHROSCOPY, WITH URETEROSCOPY AND/OR PYELOSCOPY; WITH LITHOTRIPSY INCLUDING INSERTION OF INDWELLING URETERAL STENT; Location: [facility]; Surgeon: Macy Mis, MD   TONSILLECTOMY     TYMPANOSTOMY TUBE PLACEMENT      Allergies  Allergen Reactions   Justicia Adhatoda (Malabar Nut Tree) [Justicia Adhatoda] Anaphylaxis    Other reaction(s): SWELLING   Peanut-Containing Drug Products Anaphylaxis    Other reaction(s): SWELLING   Lidocaine Hcl Swelling    NOTE: received amide anesthetics (lidocaine + bupivacaine) for epidural on 05/07/2015 with no documented ADRs. Patient NOT allergic per mother who stated, "dentist injected with novocaine causing side of her face swell. No other problems, such as, hypotension fainting etc." Lidocaine deemed appropriate for use by anesthesia.   Peanut Oil Hives    Prior to Admission medications   Medication Sig Start Date End Date Taking? Authorizing Provider  Prenatal Vit-Fe Fumarate-FA  (MULTIVITAMIN-PRENATAL) 27-0.8 MG TABS tablet Take 1 tablet by mouth daily at 12 noon.    [provider]     Prenatal care site: [facility]   Social History: She  reports that she has never smoked. She has never used smokeless tobacco. She reports that she does not currently use alcohol. She reports that she does not use drugs.  Family History: family history includes Breast cancer (age of onset: 80) in her cousin; Breast cancer (age of onset: 59) in her paternal aunt; Breast cancer (age of onset: 51) in an other family member; Breast cancer (age of onset: 45) in her paternal grandmother; Kidney cancer in her paternal grandfather.   Review of Systems: A full review of systems was performed and negative except as noted in the HPI.     Physical Exam:  Vital Signs: LMP 01/03/2021  General: no acute distress.  HEENT: normocephalic, atraumatic Heart: regular rate & rhythm.  No murmurs/rubs/gallops Lungs: clear to auscultation bilaterally, normal respiratory effort Abdomen: soft, gravid, non-tender;  EFW: 8lb Pelvic:   External: Normal external female genitalia  Cervix:1.5/70/ballottable   Extremities: non-tender, symmetric, mild edema bilaterally.  DTRs: +2  Neurologic: Alert & oriented x 3.    Results for orders placed or performed during the hospital encounter of 10/03/21 (from the past 24 hour(s))  CBC     Status: Abnormal   Collection Time: 10/03/21 12:53 PM  Result Value Ref Range   WBC 14.3 (H) 4.0 - 10.5 K/uL   RBC 4.10 3.87 - 5.11 MIL/uL   Hemoglobin 9.8 (L) 12.0 - 15.0 g/dL   HCT 63.8 (L) 45.3 - 64.6 %   MCV 75.6 (L) 80.0 - 100.0 fL  MCH 23.9 (L) 26.0 - 34.0 pg   MCHC 31.6 30.0 - 36.0 g/dL   RDW 90.3 (H) 00.9 - 23.3 %   Platelets 236 150 - 400 K/uL   nRBC 0.0 0.0 - 0.2 %    Pertinent Results:  Prenatal Labs: Blood type/Rh O pos  Antibody screen neg  Rubella NON-Immune  Varicella Immune  RPR NR  HBsAg Neg  HIV NR  GC neg  Chlamydia neg   Genetic screening declined  1 hour GTT 124  3 hour GTT N/a  GBS Negative   FHT: 145bpm, moderate variability, accels present, no decelerations TOCO: occasional contractions SVE: 1.5/70/ballottable   Oblique, head to the right by bedside US.  No results found.  Assessment:  Kristen Stevens is a 26 y.o. G2P1001 female at [redacted]w[redacted]d with IOL for polyhydramnios.   Plan:  1. Admit to Labor & Delivery; consents reviewed and obtained  - Dr. Jean Rosenthal aware and agrees with POC  2. Fetal Well being  - Fetal Tracing: Cat I - Group B Streptococcus ppx indicated: n/a, GBS negative - Presentation: initially oblique, head to the right by bedside US, confirmed with [place]. Requested Dr. Jean Rosenthal come evaluate fetal position to determine next steps for IOL. When Dr. Jean Rosenthal did bedside US, fetal head vertex and midline. Will proceed with induction of labor.  3. Routine OB: - Prenatal labs reviewed, as above - Rh pos - CBC, T&S, RPR on admit - Clear fluids, saline lock  4. Induction of Labor -  Contractions occasional, external toco in place -  Pelvis proven to 3370g -  Plan for induction with cytotec, pitocin, and AROM -  Plan for continuous fetal monitoring  -  Maternal pain control as desired; requesting regional anesthesia and unmedicated comfort measures - Anticipate vaginal delivery  5. Post Partum Planning: - Infant feeding: breastfeeding - Contraception: TBD  [name], CNM 10/03/21 1:30 PM

## 2021-10-03 NOTE — Progress Notes (Signed)
Patient ID: Kristen Stevens, female   DOB: 19-Oct-1995, 26 y.o.   MRN: [id number]  Asked by CNM to verify fetal presentation.  By bedside ultrasound the fetal head is cephalic with no funic presentation by doppler interrogation.  Safe to proceed with IOL.  Discussed that fetal lie could be unstable and if the baby changes presentation again, we might have to consider abdominal delivery.  Thomasene Mohair, MD, [facility] 10/03/2021 1:30 PM

## 2021-10-03 NOTE — Progress Notes (Signed)
Labor Progress Note  Kristen Stevens is a 26 y.o. G2P1001 at [redacted]w[redacted]d by LMP admitted for induction of labor due to polyhydramnios.  Subjective: She feels pelvic pressure and occasional contractions  Objective: LMP 01/03/2021 , BP 114/82 Notable VS details: reviewed  Fetal Assessment: FHT:  FHR: 145 bpm, variability: moderate,  accelerations:  Present,  decelerations:  Absent Category/reactivity:  Category I UC:   irregular, every 2-5 minutes SVE:    Dilation: 2cm  Effacement: 50%  Station:  -2  Consistency: medium  Position: anterior  Membrane status:intact Amniotic color: n/a  Labs: Lab Results  Component Value Date   WBC 14.3 (H) 10/03/2021   HGB 9.8 (L) 10/03/2021   HCT 31.0 (L) 10/03/2021   MCV 75.6 (L) 10/03/2021   PLT 236 10/03/2021    Assessment / Plan: 26 year old G2P1001 at [redacted]w[redacted]d here for IOL for polyhydramnios  Labor:  Progressing normally after one dose of cytotec, will switch to pitocin. Fetal vertex confirmed with SVE. Preeclampsia:  no signs or symptoms of toxicity Fetal Wellbeing:  Category I Pain Control:  Labor support without medications I/D:  n/a Anticipated MOD:  NSVD  [name], CNM 10/03/2021, 5:30 PM

## 2021-10-04 ENCOUNTER — Inpatient Hospital Stay: Payer: Medicaid Other | Admitting: Anesthesiology

## 2021-10-04 ENCOUNTER — Encounter: Payer: Self-pay | Admitting: Obstetrics and Gynecology

## 2021-10-04 LAB — RPR: RPR Ser Ql: NONREACTIVE

## 2021-10-04 MED ORDER — FENTANYL-BUPIVACAINE-NACL 0.5-0.125-0.9 MG/250ML-% EP SOLN
12.0000 mL/h | EPIDURAL | Status: DC | PRN
  Administered 2021-10-04: 12 mL/h via EPIDURAL
  Filled 2021-10-04: qty 250

## 2021-10-04 MED ORDER — MEASLES, MUMPS & RUBELLA VAC IJ SOLR
0.5000 mL | INTRAMUSCULAR | Status: DC | PRN
  Filled 2021-10-04: qty 0.5

## 2021-10-04 MED ORDER — FENTANYL-BUPIVACAINE-NACL 0.5-0.125-0.9 MG/250ML-% EP SOLN
EPIDURAL | Status: AC
  Filled 2021-10-04: qty 250

## 2021-10-04 MED ORDER — SODIUM CHLORIDE 0.9 % IV SOLN
INTRAVENOUS | Status: DC | PRN
  Administered 2021-10-04: 8 mL via EPIDURAL

## 2021-10-04 MED ORDER — PHENYLEPHRINE 80 MCG/ML (10ML) SYRINGE FOR IV PUSH (FOR BLOOD PRESSURE SUPPORT)
80.0000 ug | PREFILLED_SYRINGE | INTRAVENOUS | Status: DC | PRN
  Administered 2021-10-04: 80 ug via INTRAVENOUS
  Filled 2021-10-04: qty 10

## 2021-10-04 MED ORDER — LIDOCAINE-EPINEPHRINE (PF) 1.5 %-1:200000 IJ SOLN
INTRAMUSCULAR | Status: DC | PRN
  Administered 2021-10-04: 3 mL via EPIDURAL

## 2021-10-04 MED ORDER — EPHEDRINE 5 MG/ML INJ
10.0000 mg | INTRAVENOUS | Status: DC | PRN
  Filled 2021-10-04 (×2): qty 5

## 2021-10-04 MED ORDER — DIPHENHYDRAMINE HCL 50 MG/ML IJ SOLN: 12.5000 mg | INTRAMUSCULAR | Status: DC | PRN

## 2021-10-04 MED ORDER — EPHEDRINE 5 MG/ML INJ
10.0000 mg | INTRAVENOUS | Status: AC | PRN
  Administered 2021-10-04 (×2): 10 mg via INTRAVENOUS

## 2021-10-04 MED ORDER — PHENYLEPHRINE 80 MCG/ML (10ML) SYRINGE FOR IV PUSH (FOR BLOOD PRESSURE SUPPORT): 80.0000 ug | PREFILLED_SYRINGE | INTRAVENOUS | Status: DC | PRN

## 2021-10-04 MED ORDER — TETANUS-DIPHTH-ACELL PERTUSSIS 5-2.5-18.5 LF-MCG/0.5 IM SUSY
0.5000 mL | PREFILLED_SYRINGE | INTRAMUSCULAR | Status: DC | PRN
  Filled 2021-10-04: qty 0.5

## 2021-10-04 MED ORDER — LACTATED RINGERS IV SOLN
500.0000 mL | Freq: Once | INTRAVENOUS | Status: AC
  Administered 2021-10-04: 500 mL via INTRAVENOUS

## 2021-10-04 MED ORDER — AMMONIA AROMATIC IN INHA
RESPIRATORY_TRACT | Status: AC
  Filled 2021-10-04: qty 10

## 2021-10-04 MED ORDER — LIDOCAINE HCL (PF) 1 % IJ SOLN
INTRAMUSCULAR | Status: DC | PRN
  Administered 2021-10-04: 3 mL via SUBCUTANEOUS

## 2021-10-04 NOTE — Progress Notes (Signed)
Pt c/o pressure in vagina.  Anest MD and CRNA evaluated level and pain control and found to be adequate.  Pt appears uncomfortable between ctx but it unable to articulate what she is feeling.  She denies pain but reports that she can feel catheter.

## 2021-10-04 NOTE — Progress Notes (Signed)
Labor Progress Note  Kristen Stevens is a 26 y.o. G2P1001 at [redacted]w[redacted]d by LMP admitted for induction of labor due to polyhydramnios.  Subjective: She states her contractions are uncomfortable and is requesting an epidural.  Objective: BP 117/80   Pulse (!) 114   Temp 97.6 F (36.4 C) (Oral)   Resp 18   Ht 5\' 2"  (1.575 m)   Wt 89 kg   LMP 01/03/2021   BMI 35.89 kg/m  Notable VS details: reviewed  Fetal Assessment: FHT:  FHR: 145 bpm, variability: moderate,  accelerations:  Present,  decelerations:  Absent Category/reactivity:  Category I UC:   regular, every 1-3 minutes SVE:    Dilation: 2cm  Effacement: 50%  Station:  -2  Consistency: medium  Position: anterior  Membrane status:intact Amniotic color: n/a  Labs: Lab Results  Component Value Date   WBC 14.3 (H) 10/03/2021   HGB 9.8 (L) 10/03/2021   HCT 31.0 (L) 10/03/2021   MCV 75.6 (L) 10/03/2021   PLT 236 10/03/2021    Assessment / Plan: 26 year old G2P1001 here for induction of labor d/t polyhydramnios  Labor:  She received one dose of cytotec and progressed from 1.5cm-2cm. Switched to pitocin but no cervical change with 6hrs. Stopped pitocin and gave a second dose of cytotec at  2355. She is contracting and uncomfortable with her contractions, but no cervical change. She is requesting an epidural. Will get epidural then consider AROM. Preeclampsia:  no signs or symptoms of toxicity Fetal Wellbeing:  Category I Pain Control:   requesting epidural I/D:  n/a Anticipated MOD:  NSVD  2356, CNM 10/04/2021, 4:31 AM

## 2021-10-04 NOTE — Anesthesia Preprocedure Evaluation (Signed)
Anesthesia Evaluation  Patient identified by MRN, date of birth, ID band Patient awake    Reviewed: Allergy & Precautions, NPO status , Patient's Chart, lab work & pertinent test results  History of Anesthesia Complications Negative for: history of anesthetic complications  Airway Mallampati: III  TM Distance: >3 FB Neck ROM: Full    Dental no notable dental hx. (+) Teeth Intact   Pulmonary neg pulmonary ROS, neg sleep apnea, neg COPD, Patient abstained from smoking.Not current smoker,    Pulmonary exam normal breath sounds clear to auscultation       Cardiovascular Exercise Tolerance: Good METS(-) hypertension(-) CAD and (-) Past MI negative cardio ROS  (-) dysrhythmias  Rhythm:Regular Rate:Normal - Systolic murmurs    Neuro/Psych negative neurological ROS  negative psych ROS   GI/Hepatic neg GERD  ,(+)     (-) substance abuse  ,   Endo/Other  neg diabetes  Renal/GU negative Renal ROS     Musculoskeletal   Abdominal   Peds  Hematology  (+) Blood dyscrasia, anemia ,   Anesthesia Other Findings Past Medical History: No date: Anemia affecting pregnancy in third trimester No date: Kidney stones  Reproductive/Obstetrics (+) Pregnancy                             Anesthesia Physical Anesthesia Plan  ASA: 2  Anesthesia Plan: Epidural   Post-op Pain Management:    Induction:   PONV Risk Score and Plan: 2 and Treatment may vary due to age or medical condition and Ondansetron  Airway Management Planned: Natural Airway  Additional Equipment:   Intra-op Plan:   Post-operative Plan:   Informed Consent: I have reviewed the patients History and Physical, chart, labs and discussed the procedure including the risks, benefits and alternatives for the proposed anesthesia with the patient or authorized representative who has indicated his/her understanding and acceptance.       Plan  Discussed with: Surgeon  Anesthesia Plan Comments: (Discussed R/B/A of neuraxial anesthesia technique with patient: - rare risks of spinal/epidural hematoma, nerve damage, infection - Risk of PDPH - Risk of itching - Risk of nausea and vomiting - Risk of poor block necessitating replacement of epidural. - Risk of allergic reactions. Patient voiced understanding.)        Anesthesia Quick Evaluation

## 2021-10-04 NOTE — Progress Notes (Signed)
Labor Progress Note  Kristen Stevens is a 26 y.o. G2P1001 at [redacted]w[redacted]d by LMP admitted for h/o polyhydramnios and unstable lie  Subjective: Pt having to breath through her contractions  Objective: BP 121/76   Pulse (!) 113   Temp 99.6 F (37.6 C) (Oral)   Resp 18   Ht 5\' 2"  (1.575 m)   Wt 89 kg   LMP 01/03/2021   SpO2 99%   BMI 35.89 kg/m   Fetal Assessment: FHT:  FHR: 135 bpm, variability: moderate,  accelerations:  Present,  decelerations:  Present variable and earlies  Category/reactivity:  Category I and Category II UC:   regular, every 3-4 minutes SVE:    Dilation: 8cm  Effacement: 100%  Station:  +1  Consistency: soft  Position: middle  Membrane status: AROM at 1100 Amniotic color: clear  Labs: Lab Results  Component Value Date   WBC 14.3 (H) 10/03/2021   HGB 9.8 (L) 10/03/2021   HCT 31.0 (L) 10/03/2021   MCV 75.6 (L) 10/03/2021   PLT 236 10/03/2021    Assessment / Plan: Induction of labor due to polyhydramnios and unstable lie 7/20 1334 Cytotec x2  1750-2345 Pitocin  2355 Cytotec x2 7/21 0847 SROM with blood show  1001 pitocin started  Currently at 73mU  Labor: Progressing normally Preeclampsia:   117/68 Fetal Wellbeing:  Category I and Category II Pain Control:  Epidural I/D:   Afebrile, GBS neg, AROM x9 hrs Anticipated MOD:  NSVD  38m, CNM 10/04/2021, 8:21 PM

## 2021-10-04 NOTE — Discharge Summary (Shared)
Obstetrical Discharge Summary  Patient Name: Kristen Stevens DOB: July 10, 1995 MRN: [id number]  Date of Admission: 10/03/2021 Date of Delivery: 10/04/21 Delivered by: Annamary Rummage CNM Date of Discharge: 10/06/2021  Primary OB: [facility]  DQQ:[id number] last menstrual period was 01/03/2021. EDC Estimated Date of Delivery: 10/10/21 Gestational Age at Delivery: [redacted]w[redacted]d   Antepartum complications:  Allergic to peanuts (carries epipen) Allergic to lidocaine Kidney stones in pregnancy Polyhydramnios  Obesity Rubella Non-immune Anemia Unstable fetal lie (transverse, head right @ 38wks)  Admitting Diagnosis: IOL for polyhydramnios and unstable lie Secondary Diagnosis: Patient Active Problem List   Diagnosis Date Noted   NSVD (normal spontaneous vaginal delivery) 10/04/2021   Abdominal pain during pregnancy in second trimester 07/08/2021   Acute right flank pain 07/04/2021   Kidney stone complicating pregnancy, second trimester 07/04/2021   Back pain affecting pregnancy in second trimester 06/12/2021   Encounter for supervision of other normal pregnancy, second trimester 03/22/2021   Family history of breast cancer 12/25/2016   Dysuria 11/26/2016   Urinary frequency 11/26/2016   Postpartum care following vaginal delivery 05/09/2015   Post-dates pregnancy 05/06/2015   Labor and delivery, indication for care 05/05/2015   Abdominal pain affecting pregnancy 05/05/2015    Augmentation: AROM, Pitocin, and Cytotec Complications: None  Intrapartum complications/course:  Delivery Type: spontaneous vaginal delivery Anesthesia: epidural Placenta: spontaneous Laceration: 1st degree Episiotomy: none Newborn Data: Live born female  Birth Weight:  4010g APGAR: 7,9   Newborn Delivery   Birth date/time: 10/04/2021 21:41:00 Delivery type: Vaginal, Spontaneous      26 y.o. G2P1001 at [redacted]w[redacted]d presenting for IOL, AROM with clear fluid.  She progressed to complete and pushed over an intact  perineum and delivered the fetal head.  One minute shoulder dystocia resolved with McRoberts and Suprapubic pressure. She was in control the whole time, and the baby placed on the maternal abdomen. Cord clamped immediately. The placenta delivered spontaneously and intact. Small 1st degree laceration repair in the standard fashion. Mom and baby tolerated the procedure well.  Baby taken to [facility] for observation.  Postpartum Procedures: Venofer infusion x 1  Post partum course:  Patient had an uncomplicated postpartum course.  By time of discharge on PPD#2, her pain was controlled on oral pain medications; she had appropriate lochia and was ambulating, voiding without difficulty and tolerating regular diet.  She was deemed stable for discharge to home.    Discharge Physical Exam:  BP 117/71 (BP Location: Left Arm)   Pulse 71   Temp (!) 97.5 F (36.4 C) (Oral)   Resp 18   Ht 5\' 2"  (1.575 m)   Wt 89 kg   LMP 01/03/2021   SpO2 99%   Breastfeeding Unknown   BMI 35.89 kg/m   General: alert and no distress Pulm: normal respiratory effort Lochia: appropriate Abdomen: soft, NT Uterine Fundus: firm, below umbilicus Perineum: Minimal edema/intact  Extremities: No evidence of DVT seen on physical exam. No lower extremity edema. Edinburgh:     10/05/2021    9:56 PM  Edinburgh Postnatal Depression Scale Screening Tool  I have been able to laugh and see the funny side of things. 0  I have looked forward with enjoyment to things. 0  I have blamed myself unnecessarily when things went wrong. 1  I have been anxious or worried for no good reason. 0  I have felt scared or panicky for no good reason. 1  Things have been getting on top of me. 1  I have been so  unhappy that I have had difficulty sleeping. 0  I have felt sad or miserable. 1  I have been so unhappy that I have been crying. 0  The thought of harming myself has occurred to me. 0  Edinburgh Postnatal Depression Scale Total 4      Labs:    Latest Ref Rng & Units 10/05/2021    5:33 AM 10/03/2021   12:53 PM 07/04/2021   10:15 PM  CBC  WBC 4.0 - 10.5 K/uL 20.3  14.3  15.4   Hemoglobin 12.0 - 15.0 g/dL 8.3  9.8  28.3   Hematocrit 36.0 - 46.0 % 26.3  31.0  31.7   Platelets 150 - 400 K/uL 192  236  216    O POS Hemoglobin  Date Value Ref Range Status  10/05/2021 8.3 (L) 12.0 - 15.0 g/dL Final    Comment:    Reticulocyte Hemoglobin testing may be clinically indicated, consider ordering this additional test [id number]    HCT  Date Value Ref Range Status  10/05/2021 26.3 (L) 36.0 - 46.0 % Final    Disposition: stable, discharge to home Baby Feeding: breastmilk Baby Disposition: home with mom  Contraception: Mirena IUD   Prenatal Labs:  Blood type/Rh O pos  Antibody screen neg  Rubella NON-Immune  Varicella Immune  RPR NR  HBsAg Neg  HIV NR  GC neg  Chlamydia neg  Genetic screening declined  1 hour GTT 124  3 hour GTT N/a  GBS Negative   Rh Immune globulin given: n/a Rubella vaccine given: given postpartum 10/06/2021 Varicella vaccine given: Immune Tdap vaccine given in AP or PP setting: needed Flu vaccine given in AP or PP setting: not in season  Plan: Yaelis Pepi was discharged to home in good condition.   Discharge Instructions: Per After Visit Summary. Activity: Advance as tolerated. Pelvic rest for 6 weeks.   Diet: Regular Discharge Medications: Allergies as of 10/06/2021       Reactions   Justicia Adhatoda (malabar Nut Tree) [justicia Adhatoda] Anaphylaxis   Other reaction(s): SWELLING   Peanut-containing Drug Products Anaphylaxis   Other reaction(s): SWELLING   Lidocaine Hcl Swelling   NOTE: received amide anesthetics (lidocaine + bupivacaine) for epidural on 05/07/2015 with no documented ADRs. Patient NOT allergic per mother who stated, "dentist injected with novocaine causing side of her face swell. No other problems, such as, hypotension fainting etc." Lidocaine deemed  appropriate for use by anesthesia.   Peanut Oil Hives        Medication List     TAKE these medications    acetaminophen 325 MG tablet Commonly known as: Tylenol Take 2 tablets (650 mg total) by mouth every 4 (four) hours as needed for mild pain (for pain scale < 4).   ferrous sulfate 325 (65 FE) MG tablet Take 1 tablet (325 mg total) by mouth 2 (two) times daily with a meal.   ibuprofen 600 MG tablet Commonly known as: ADVIL Take 1 tablet (600 mg total) by mouth every 6 (six) hours as needed for mild pain or cramping.   multivitamin-prenatal 27-0.8 MG Tabs tablet Take 1 tablet by mouth daily at 12 noon.       Outpatient follow up:   Follow-up Information     Haroldine Laws, CNM. Schedule an appointment as soon as possible for a visit in 6 week(s).   Specialty: Certified Nurse Midwife Why: postpartum visit and IUD placement Contact information: [address] [place] [address] [phone]  Signed:  Margaretmary Eddy, CNM Certified Nurse Midwife [facility]

## 2021-10-04 NOTE — Anesthesia Procedure Notes (Signed)
Epidural Patient location during procedure: OB  Staffing Anesthesiologist: [name], MD Performed: anesthesiologist   Preanesthetic Checklist Completed: patient identified, IV checked, site marked, risks and benefits discussed, surgical consent, monitors and equipment checked, pre-op evaluation and timeout performed  Epidural Patient position: sitting Prep: ChloraPrep Patient monitoring: heart rate, continuous pulse ox and blood pressure Approach: midline Location: L3-L4 Injection technique: LOR saline  Needle:  Needle type: Tuohy  Needle gauge: 17 G Needle length: 9 cm Needle insertion depth: 8 cm Catheter type: closed end flexible Catheter size: 19 Gauge Catheter at skin depth: 13 cm Test dose: negative and 1.5% lidocaine with Epi 1:200 K  Assessment Sensory level: T10 Events: blood not aspirated, injection not painful, no injection resistance, no paresthesia and negative IV test  Additional Notes first attempt Pt. Evaluated and documentation done after procedure finished. Patient identified. Risks/Benefits/Options discussed with patient including but not limited to bleeding, infection, nerve damage, paralysis, failed block, incomplete pain control, headache, blood pressure changes, nausea, vomiting, reactions to medication both or allergic, itching and postpartum back pain. Confirmed with bedside nurse the patient's most recent platelet count. Confirmed with patient that they are not currently taking any anticoagulation, have any bleeding history or any family history of bleeding disorders. Patient expressed understanding and wished to proceed. All questions were answered. Sterile technique was used throughout the entire procedure. Please see nursing notes for vital signs. Test dose was given through epidural catheter and negative prior to continuing to dose epidural or start infusion. Warning signs of high block given to the patient including shortness of breath,  tingling/numbness in hands, complete motor block, or any concerning symptoms with instructions to call for help. Patient was given instructions on fall risk and not to get out of bed. All questions and concerns addressed with instructions to call with any issues or inadequate analgesia.     Patient tolerated the insertion well without immediate complications.  Reason for block: procedure for painReason for block:procedure for pain

## 2021-10-04 NOTE — Progress Notes (Signed)
Labor Progress Note  Kristen Stevens is a 26 y.o. G2P1001 at [redacted]w[redacted]d by LMP admitted for h/o polyhydramnios and unstable lie  Subjective: She is comfortable with epidural and requesting AROM  Objective: BP 120/82   Pulse 100   Temp 97.9 F (36.6 C) (Oral)   Resp 18   Ht 5\' 2"  (1.575 m)   Wt 89 kg   LMP 01/03/2021   SpO2 100%   BMI 35.89 kg/m   Fetal Assessment: FHT:  FHR: 145 bpm, variability: moderate,  accelerations:  Present,  decelerations:  Present variable  Category/reactivity:  Category I UC:   regular, every 2-4 minutes SVE:    Dilation: 2.5cm  Effacement: 80%  Station:  -1  Consistency: medium  Position: middle  Membrane status: AROM at 1100 Amniotic color: clear  Labs: Lab Results  Component Value Date   WBC 14.3 (H) 10/03/2021   HGB 9.8 (L) 10/03/2021   HCT 31.0 (L) 10/03/2021   MCV 75.6 (L) 10/03/2021   PLT 236 10/03/2021    Assessment / Plan: Induction of labor due to polyhydramnios and unstable lie 7/20 1334 Cytotec x2  1750-2345 Pitocin  2355 Cytotec x2 7/21 0847 SROM with blood show  Labor: Progressing normally - Pitocin at 24mU Preeclampsia:   120/82 Fetal Wellbeing:  Category I Pain Control:  Epidural I/D:   Afebrile, GBS neg, AROM x0 hrs Anticipated MOD:  NSVD  [name], CNM 10/04/2021, 1:58 PM

## 2021-10-04 NOTE — Progress Notes (Signed)
Pt continues to report pressure in vagina and rectum constantly.  Is not coping well and has difficulty articulating where and what is making her uncomfortable.    Position changes used to help relieve pressure with minimal relief.

## 2021-10-04 NOTE — Progress Notes (Signed)
Labor Progress Note  Kristen Stevens is a 26 y.o. G2P1001 at [redacted]w[redacted]d by LMP admitted for h/o polyhydramnios and unstable lie  Subjective: She is more comfortable with her epidural but needs to press the button  Objective: BP (!) 117/59   Pulse 75   Temp 97.6 F (36.4 C) (Oral)   Resp 18   Ht 5\' 2"  (1.575 m)   Wt 89 kg   LMP 01/03/2021   BMI 35.89 kg/m   Fetal Assessment: FHT:  FHR: 140 bpm, variability: moderate,  accelerations:  Present,  decelerations:  Absent Category/reactivity:  Category I UC:   regular, every 1-3 minutes SVE:    Dilation: 2.5cm  Effacement: 80%  Station:  -1  Consistency: medium  Position: middle  Membrane status:SROM Amniotic color: with blood show  Labs: Lab Results  Component Value Date   WBC 14.3 (H) 10/03/2021   HGB 9.8 (L) 10/03/2021   HCT 31.0 (L) 10/03/2021   MCV 75.6 (L) 10/03/2021   PLT 236 10/03/2021    Assessment / Plan: Induction of labor due to polyhydramnios and unstable lie 7/20 1334 Cytotec x2  1750-2345 Pitocin  2355 Cytotec x2 7/21 0847 SROM with blood show  Labor: Progressing normally - will restart Pitocin Preeclampsia:   117/59 Fetal Wellbeing:  Category I Pain Control:  Epidural I/D:   Afebrile, GBS neg, SROM x0 hrs Anticipated MOD:  NSVD  8/21, CNM 10/04/2021, 9:48 AM

## 2021-10-04 NOTE — Progress Notes (Signed)
Pt continues to have a difficult time coping with pressure in vagina constantly.  When asked when and where she feels discomfort she reports that "there is a lot in her vagina."  She has yet to find relief but reports that the foley cath is bothering her.    Discussed options and pt is agreeable to taking out cath and intermittent cath q1-2 hours.  Foley removed and pt appears more comfortable between ctx although continues to have difficulty specifically describing or articulating what is bothering her.

## 2021-10-04 NOTE — Progress Notes (Signed)
Labor Progress Note  Kristen Stevens is a 26 y.o. G2P1001 at [redacted]w[redacted]d by LMP admitted for h/o polyhydramnios and unstable lie  Subjective: Pt seems to be in a lot of pain describing it as pressure  Objective: BP 117/68   Pulse (!) 102   Temp 98.2 F (36.8 C) (Oral)   Resp 18   Ht 5\' 2"  (1.575 m)   Wt 89 kg   LMP 01/03/2021   SpO2 100%   BMI 35.89 kg/m   Fetal Assessment: FHT:  FHR: 150 bpm, variability: moderate,  accelerations:  Present,  decelerations:  Present variable  Category/reactivity:  Category I and Category II UC:   regular, every 3-6 minutes SVE:    Dilation: 3cm  Effacement: 80%  Station:  -1  Consistency: medium  Position: middle  Membrane status: AROM at 1100 Amniotic color: clear  Labs: Lab Results  Component Value Date   WBC 14.3 (H) 10/03/2021   HGB 9.8 (L) 10/03/2021   HCT 31.0 (L) 10/03/2021   MCV 75.6 (L) 10/03/2021   PLT 236 10/03/2021    Assessment / Plan: Induction of labor due to polyhydramnios and unstable lie 7/20 1334 Cytotec x2  1750-2345 Pitocin  2355 Cytotec x2 7/21 0847 SROM with blood show  1001 pitocin started  Currently at 57mU  Labor: Progressing normally Preeclampsia:   117/68 Fetal Wellbeing:  Category I and Category II Pain Control:  Epidural I/D:   Afebrile, GBS neg, AROM x5 hrs Anticipated MOD:  NSVD  18m, CNM 10/04/2021, 4:03 PM

## 2021-10-04 NOTE — Progress Notes (Signed)
Pt continues to c/o pressur in rectum and vagina constantly.  Reports that a similar thing happened with last epidural.  Epidural level is still to umbilicus.   She appears uncomfortable between ctx but it unable to articulate what is bothering her other than pressure.  She denies any sharp pain or tenderness.

## 2021-10-05 ENCOUNTER — Encounter: Payer: Self-pay | Admitting: Obstetrics and Gynecology

## 2021-10-05 LAB — CBC
HCT: 26.3 % — ABNORMAL LOW (ref 36.0–46.0)
Hemoglobin: 8.3 g/dL — ABNORMAL LOW (ref 12.0–15.0)
MCH: 24.2 pg — ABNORMAL LOW (ref 26.0–34.0)
MCHC: 31.6 g/dL (ref 30.0–36.0)
MCV: 76.7 fL — ABNORMAL LOW (ref 80.0–100.0)
Platelets: 192 10*3/uL (ref 150–400)
RBC: 3.43 MIL/uL — ABNORMAL LOW (ref 3.87–5.11)
RDW: 16.1 % — ABNORMAL HIGH (ref 11.5–15.5)
WBC: 20.3 10*3/uL — ABNORMAL HIGH (ref 4.0–10.5)
nRBC: 0.1 % (ref 0.0–0.2)

## 2021-10-05 MED ORDER — ONDANSETRON HCL 4 MG/2ML IJ SOLN: 4.0000 mg | INTRAMUSCULAR | Status: DC | PRN

## 2021-10-05 MED ORDER — FERROUS SULFATE 325 (65 FE) MG PO TABS
325.0000 mg | ORAL_TABLET | Freq: Two times a day (BID) | ORAL | Status: DC
  Administered 2021-10-05 – 2021-10-06 (×3): 325 mg via ORAL
  Filled 2021-10-05 (×3): qty 1

## 2021-10-05 MED ORDER — IBUPROFEN 600 MG PO TABS
600.0000 mg | ORAL_TABLET | Freq: Four times a day (QID) | ORAL | Status: DC
  Administered 2021-10-05 – 2021-10-06 (×5): 600 mg via ORAL
  Filled 2021-10-05 (×5): qty 1

## 2021-10-05 MED ORDER — DIPHENHYDRAMINE HCL 25 MG PO CAPS: 25.0000 mg | ORAL_CAPSULE | Freq: Four times a day (QID) | ORAL | Status: DC | PRN

## 2021-10-05 MED ORDER — OXYCODONE HCL 5 MG PO TABS: 10.0000 mg | ORAL_TABLET | ORAL | Status: DC | PRN

## 2021-10-05 MED ORDER — BENZOCAINE-MENTHOL 20-0.5 % EX AERO: 1.0000 | INHALATION_SPRAY | CUTANEOUS | Status: DC | PRN

## 2021-10-05 MED ORDER — TETANUS-DIPHTH-ACELL PERTUSSIS 5-2.5-18.5 LF-MCG/0.5 IM SUSY
0.5000 mL | PREFILLED_SYRINGE | Freq: Once | INTRAMUSCULAR | Status: DC
  Filled 2021-10-05: qty 0.5

## 2021-10-05 MED ORDER — DOCUSATE SODIUM 100 MG PO CAPS
100.0000 mg | ORAL_CAPSULE | Freq: Two times a day (BID) | ORAL | Status: DC
  Administered 2021-10-05 (×2): 100 mg via ORAL
  Filled 2021-10-05 (×2): qty 1

## 2021-10-05 MED ORDER — SODIUM CHLORIDE 0.9 % IV SOLN
300.0000 mg | Freq: Once | INTRAVENOUS | Status: AC
  Administered 2021-10-05: 300 mg via INTRAVENOUS
  Filled 2021-10-05: qty 300

## 2021-10-05 MED ORDER — ACETAMINOPHEN 325 MG PO TABS: 650.0000 mg | ORAL_TABLET | ORAL | Status: DC | PRN

## 2021-10-05 MED ORDER — COCONUT OIL OIL: 1.0000 | TOPICAL_OIL | Status: DC | PRN

## 2021-10-05 MED ORDER — WITCH HAZEL-GLYCERIN EX PADS: 1.0000 | MEDICATED_PAD | CUTANEOUS | Status: DC | PRN

## 2021-10-05 MED ORDER — OXYCODONE HCL 5 MG PO TABS
5.0000 mg | ORAL_TABLET | ORAL | Status: DC | PRN
  Administered 2021-10-06: 5 mg via ORAL
  Filled 2021-10-05: qty 1

## 2021-10-05 MED ORDER — IBUPROFEN 600 MG PO TABS
ORAL_TABLET | ORAL | Status: AC
  Administered 2021-10-05: 600 mg via ORAL
  Filled 2021-10-05: qty 1

## 2021-10-05 MED ORDER — ONDANSETRON HCL 4 MG PO TABS: 4.0000 mg | ORAL_TABLET | ORAL | Status: DC | PRN

## 2021-10-05 MED ORDER — PRENATAL MULTIVITAMIN CH
1.0000 | ORAL_TABLET | Freq: Every day | ORAL | Status: DC
  Administered 2021-10-05: 1 via ORAL
  Filled 2021-10-05: qty 1

## 2021-10-05 MED ORDER — SIMETHICONE 80 MG PO CHEW: 80.0000 mg | CHEWABLE_TABLET | ORAL | Status: DC | PRN

## 2021-10-05 NOTE — Anesthesia Postprocedure Evaluation (Signed)
Anesthesia Post Note  Patient: Kristen Stevens  Procedure(s) Performed: AN AD HOC LABOR EPIDURAL  Patient location during evaluation: Mother Baby Anesthesia Type: Epidural Level of consciousness: awake and alert Pain management: pain level controlled Vital Signs Assessment: post-procedure vital signs reviewed and stable Respiratory status: spontaneous breathing, nonlabored ventilation and respiratory function stable Cardiovascular status: stable Postop Assessment: no headache, no backache and epidural receding Anesthetic complications: no   No notable events documented.   Last Vitals:  Vitals:   10/05/21 0812 10/05/21 0928  BP: 106/67 113/78  Pulse: 67 70  Resp: 16 18  Temp: 36.8 C 37 C  SpO2:  98%    Last Pain:  Vitals:   10/05/21 0928  TempSrc: Oral  PainSc:                  [name]

## 2021-10-05 NOTE — Progress Notes (Signed)
Postpartum Day  1  Subjective: no complaints, up ad lib, voiding, and tolerating PO  Doing well, no concerns. Ambulating without difficulty, pain managed with PO meds, tolerating regular diet, and voiding without difficulty.   No fever/chills, chest pain, shortness of breath, nausea/vomiting, or leg pain. No nipple or breast pain. No headache, visual changes, or RUQ/epigastric pain.  Objective: BP 113/78 (BP Location: Left Arm)   Pulse 70   Temp 98.6 F (37 C) (Oral)   Resp 18   Ht $R'5\' 2"'qN$  (1.575 m)   Wt 89 kg   LMP 01/03/2021   SpO2 98%   Breastfeeding Unknown   BMI 35.89 kg/m    Physical Exam:  General: alert, cooperative, and no distress Breasts: soft/nontender CV: RRR Pulm: nl effort, CTABL Abdomen: soft, non-tender, active bowel sounds Uterine Fundus: firm Perineum: minimal edema, intact Lochia: appropriate DVT Evaluation: No evidence of DVT seen on physical exam.  Recent Labs    10/03/21 1253 10/05/21 0533  HGB 9.8* 8.3*  HCT 31.0* 26.3*  WBC 14.3* 20.3*  PLT 236 192    Assessment/Plan: 26 y.o. G2P2002 postpartum day # 1  -Continue routine postpartum care -Lactation consult PRN for breastfeeding -Acute blood loss anemia - hemodynamically stable and asymptomatic; start PO ferrous sulfate BID with stool softeners  -Venofer IV iron transfusion ordered for 1 dose today  -Immunization status:   needs MMR prior to discharge    Disposition: Continue inpatient postpartum care    LOS: 2 days   Minda Meo, CNM 10/05/2021, 11:13 AM   ----- Drinda Butts  Certified Nurse Midwife [facility]

## 2021-10-06 MED ORDER — IBUPROFEN 600 MG PO TABS: 600.0000 mg | ORAL_TABLET | Freq: Four times a day (QID) | ORAL | 0 refills | Status: AC | PRN

## 2021-10-06 MED ORDER — FERROUS SULFATE 325 (65 FE) MG PO TABS: 325.0000 mg | ORAL_TABLET | Freq: Two times a day (BID) | ORAL | 3 refills | Status: AC

## 2021-10-06 MED ORDER — ACETAMINOPHEN 325 MG PO TABS: 650.0000 mg | ORAL_TABLET | ORAL | Status: AC | PRN

## 2021-10-06 NOTE — Discharge Instructions (Signed)
Vaginal Delivery, Care After Refer to this sheet in the next few weeks. These discharge instructions provide you with information on caring for yourself after delivery. Your caregiver may also give you specific instructions. Your treatment has been planned according to the most current medical practices available, but problems sometimes occur. Call your caregiver if you have any problems or questions after you go home. HOME CARE INSTRUCTIONS Take over-the-counter or prescription medicines only as directed by your caregiver or pharmacist. Do not drink alcohol, especially if you are breastfeeding or taking medicine to relieve pain. Do not smoke tobacco. Continue to use good perineal care. Good perineal care includes: Wiping your perineum from back to front Keeping your perineum clean. You can do sitz baths twice a day, to help keep this area clean Do not use tampons, douche or have sex until your caregiver says it is okay. Shower only and avoid sitting in submerged water, aside from sitz baths Wear a well-fitting bra that provides breast support. Eat healthy foods. Drink enough fluids to keep your urine clear or pale yellow. Eat high-fiber foods such as whole grain cereals and breads, brown rice, beans, and fresh fruits and vegetables every day. These foods may help prevent or relieve constipation. Avoid constipation with high fiber foods or medications, such as miralax or metamucil Follow your caregiver's recommendations regarding resumption of activities such as climbing stairs, driving, lifting, exercising, or traveling. Talk to your caregiver about resuming sexual activities. Resumption of sexual activities is dependent upon your risk of infection, your rate of healing, and your comfort and desire to resume sexual activity. Try to have someone help you with your household activities and your newborn for at least a few days after you leave the hospital. Rest as much as possible. Try to rest or  take a nap when your newborn is sleeping. Increase your activities gradually. Keep all of your scheduled postpartum appointments. It is very important to keep your scheduled follow-up appointments. At these appointments, your caregiver will be checking to make sure that you are healing physically and emotionally. SEEK MEDICAL CARE IF:  You are passing large clots from your vagina. Save any clots to show your caregiver. You have a foul smelling discharge from your vagina. You have trouble urinating. You are urinating frequently. You have pain when you urinate. You have a change in your bowel movements. You have increasing redness, pain, or swelling near your vaginal incision (episiotomy) or vaginal tear. You have pus draining from your episiotomy or vaginal tear. Your episiotomy or vaginal tear is separating. You have painful, hard, or reddened breasts. You have a severe headache. You have blurred vision or see spots. You feel sad or depressed. You have thoughts of hurting yourself or your newborn. You have questions about your care, the care of your newborn, or medicines. You are dizzy or light-headed. You have a rash. You have nausea or vomiting. You were breastfeeding and have not had a menstrual period within 12 weeks after you stopped breastfeeding. You are not breastfeeding and have not had a menstrual period by the 12th week after delivery. You have a fever. SEEK IMMEDIATE MEDICAL CARE IF:  You have persistent pain. You have chest pain. You have shortness of breath. You faint. You have leg pain. You have stomach pain. Your vaginal bleeding saturates two or more sanitary pads in 1 hour. MAKE SURE YOU:  Understand these instructions. Will watch your condition. Will get help right away if you are not doing well or   get worse. Document Released: 02/29/2000 Document Revised: 07/18/2013 Document Reviewed: 10/29/2011 Exit[employer] Patient Information 2015 [employer]. This  information is not intended to replace advice given to you by your health care provider. Make sure you discuss any questions you have with your health care provider.  Sitz Bath A sitz bath is a warm water bath taken in the sitting position. The water covers only the hips and butt (buttocks). We recommend using one that fits in the toilet, to help with ease of use and cleanliness. It may be used for either healing or cleaning purposes. Sitz baths are also used to relieve pain, itching, or muscle tightening (spasms). The water may contain medicine. Moist heat will help you heal and relax.  HOME CARE  Take 3 to 4 sitz baths a day. Fill the bathtub half-full with warm water. Sit in the water and open the drain a little. Turn on the warm water to keep the tub half-full. Keep the water running constantly. Soak in the water for 15 to 20 minutes. After the sitz bath, pat the affected area dry. GET HELP RIGHT AWAY IF: You get worse instead of better. Stop the sitz baths if you get worse. MAKE SURE YOU: Understand these instructions. Will watch your condition. Will get help right away if you are not doing well or get worse. Document Released: 04/10/2004 Document Revised: 11/26/2011 Document Reviewed: 07/01/2010 Exit[employer] Patient Information 2015 [employer]. This information is not intended to replace advice given to you by your health care provider. Make sure you discuss any questions you have with your health care provider.   

## 2021-10-06 NOTE — Progress Notes (Signed)
Pt discharged with infant.  Discharge instructions, prescriptions and follow up appointment given to and reviewed with pt. Pt verbalized understanding. Escorted out by staff. 

## 2023-03-31 IMAGING — US US RENAL
1 series · 14 of 25 positions shown · non-contrast
Comparison: 06/13/2021

CLINICAL DATA: Renal calculi, pregnant

EXAM:
RENAL / URINARY TRACT ULTRASOUND COMPLETE

[Series 1: us renal · 14 of 26 slices shown]
[im 1/26]
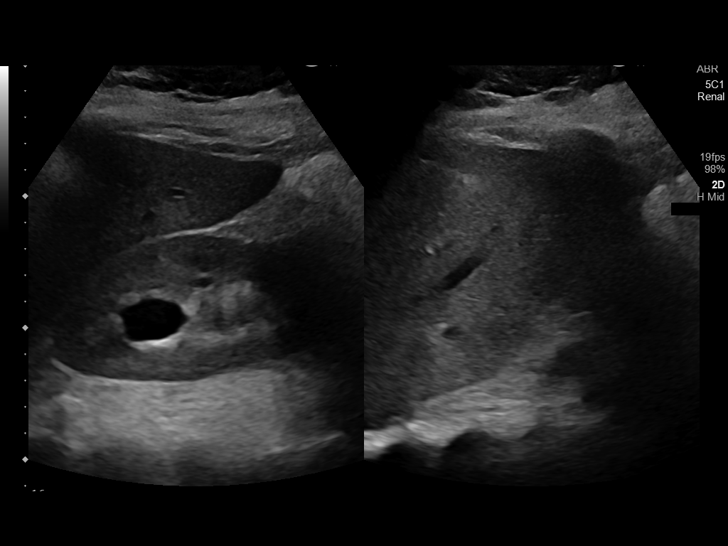
[im 3/26]
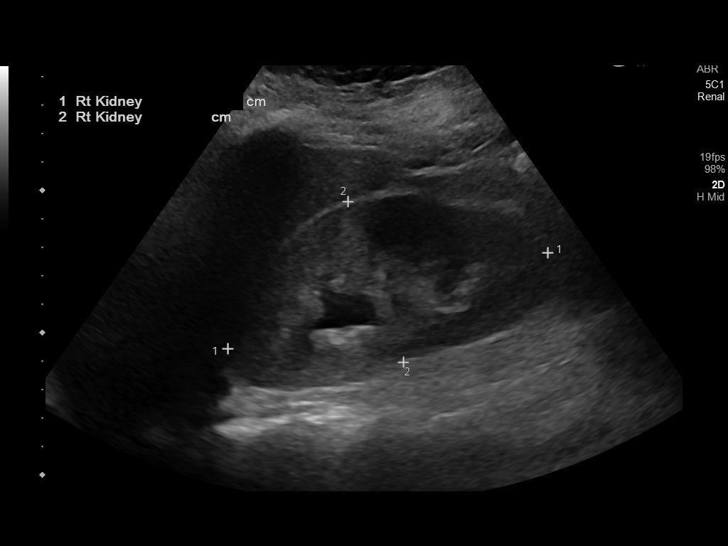
[im 5/26]
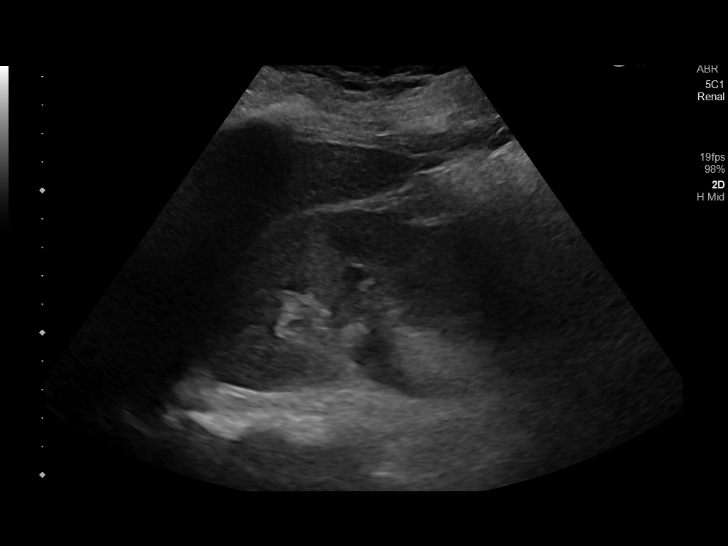
[im 7/26]
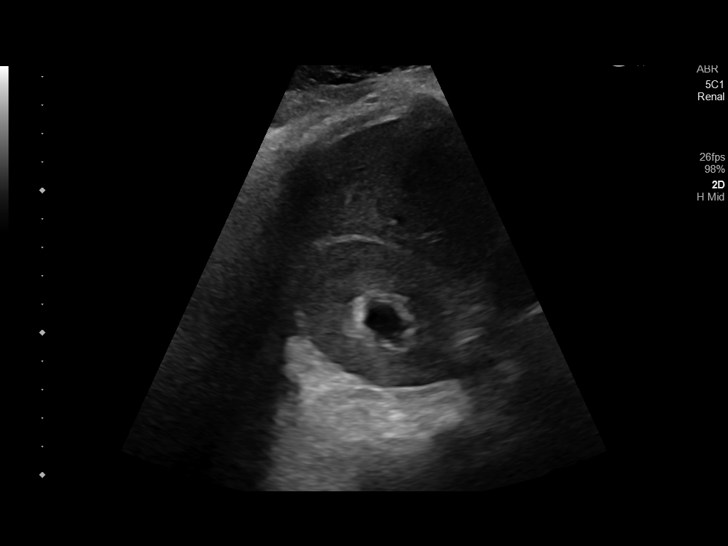
[im 9/26]
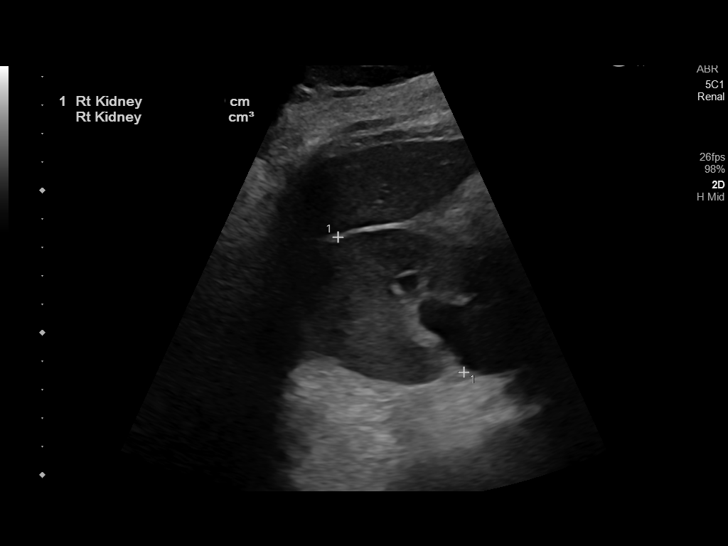
[im 10/26]
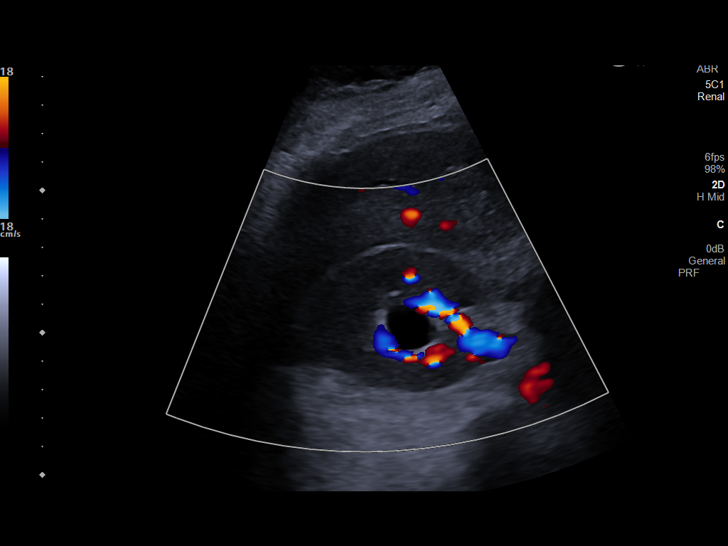
[im 12/26]
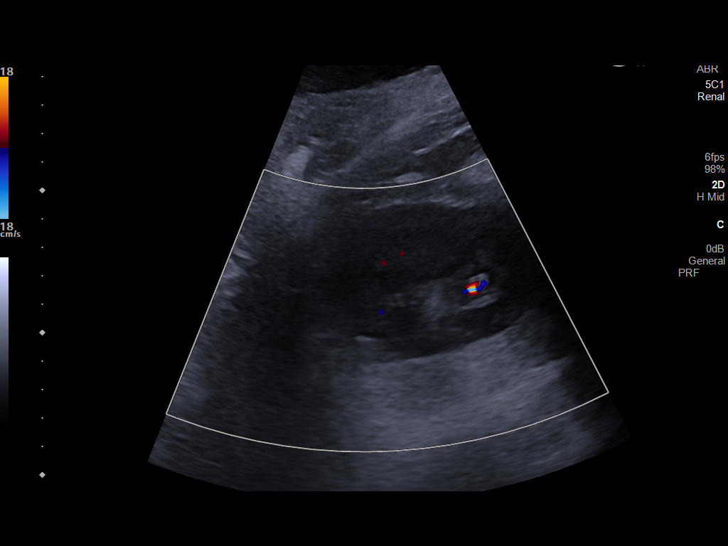
[im 14/26]
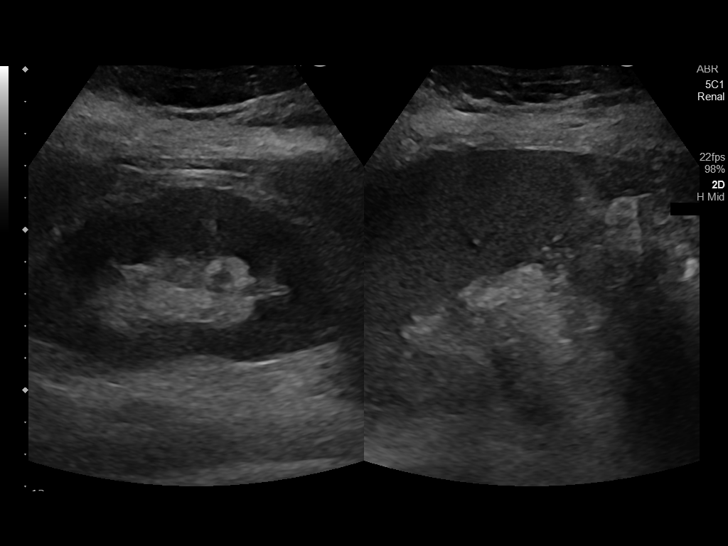
[im 16/26]
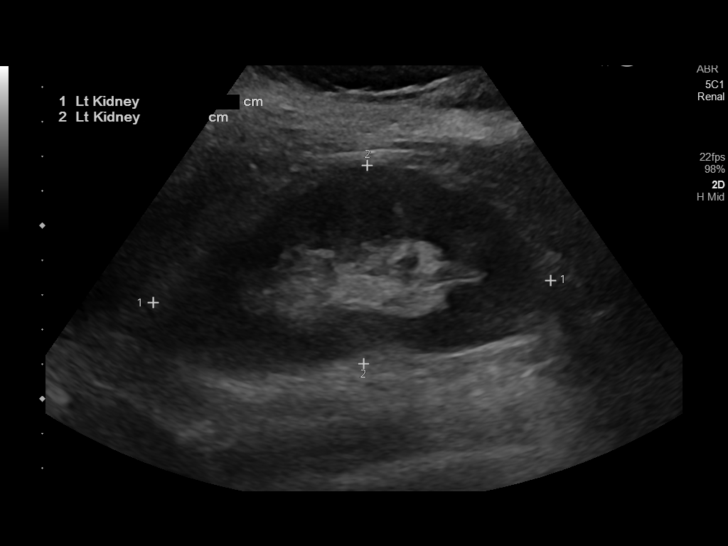
[im 17/26]
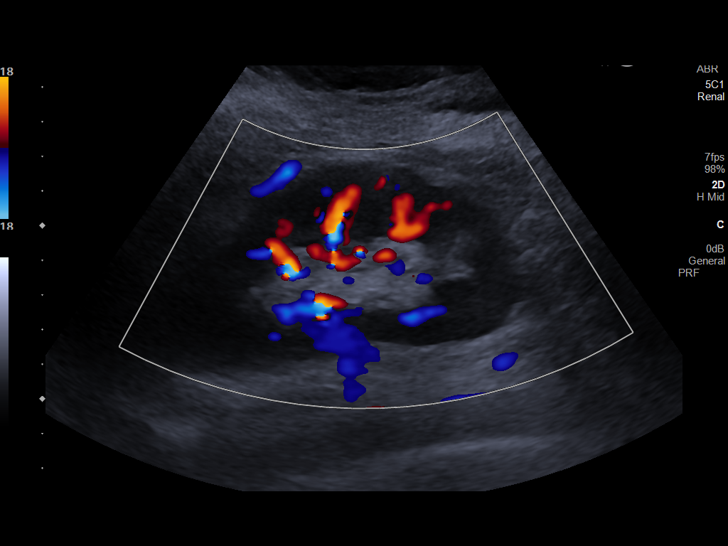
[im 19/26]
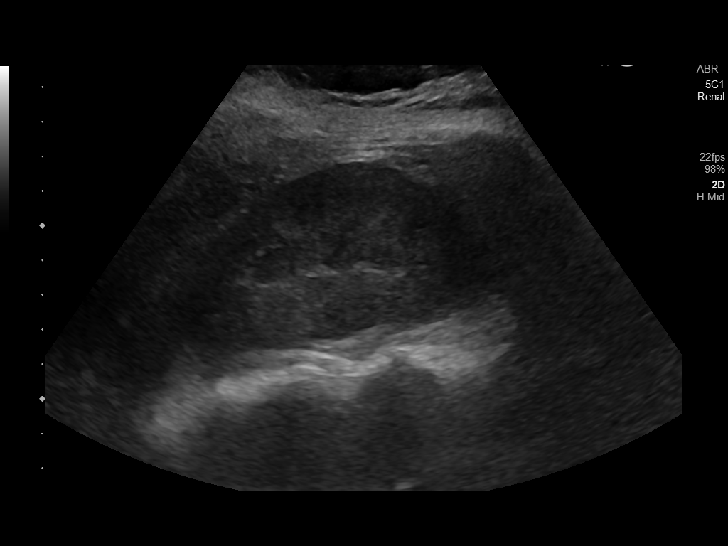
[im 21/26]
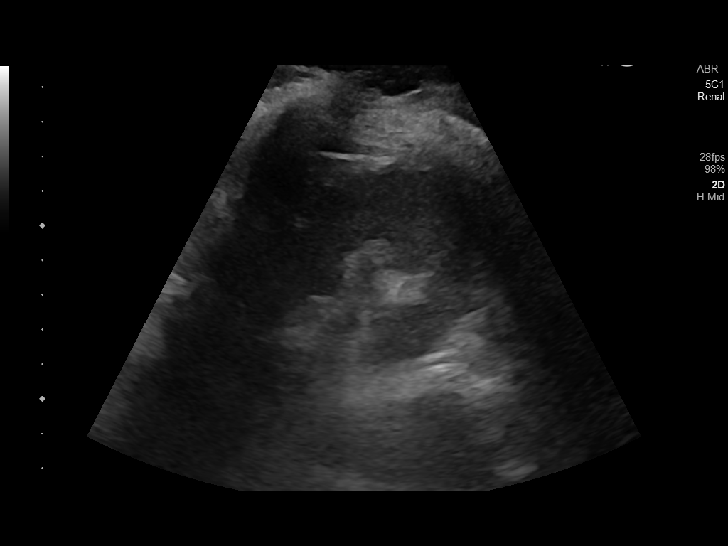
[im 23/26]
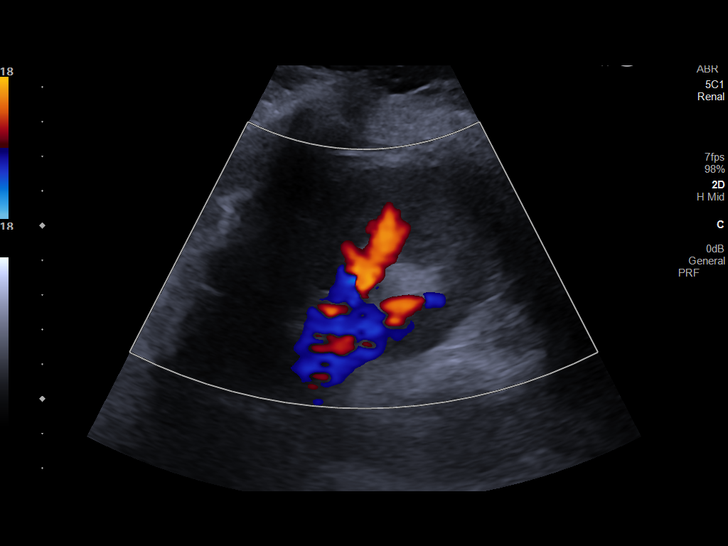
[im 26/26]
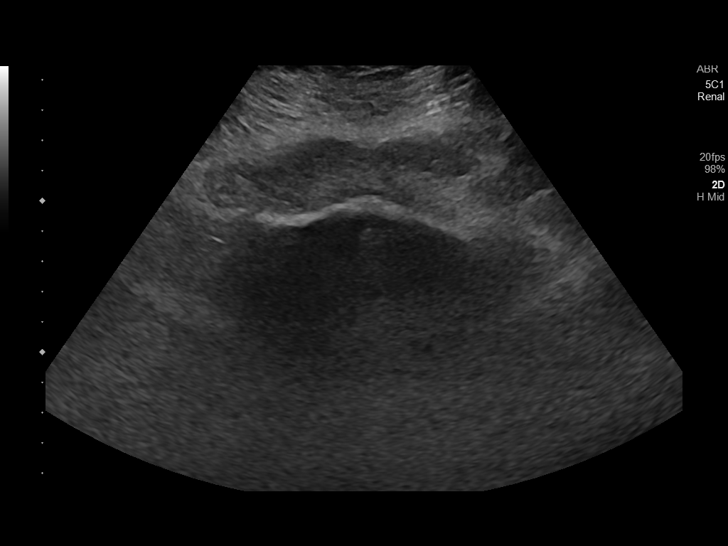

[14 of 25 positions shown; findings below may reference images not displayed]

FINDINGS: Right Kidney:

Renal measurements: 11.7 x 6.0 x 6.5 cm = volume: 238.2 mL. Stable
mild right hydronephrosis. Nonobstructing 7 mm calculus lower pole
right kidney. No renal mass. Echotexture within normal limits.

Left Kidney:

Renal measurements: 11.4 x 5.7 x 4.9 cm = volume: 170.3 mL.
Echogenicity within normal limits. No mass or hydronephrosis
visualized.

Bladder:

Bladder is decompressed as the patient voided prior to the exam.

Other:

Partial visualization of a gravid uterus.
IMPRESSION: 1. Stable mild right hydronephrosis and nonobstructing right renal
calculus.
2. Unremarkable left kidney.
3. Decompressed bladder limiting evaluation.
